# Patient Record
Sex: Male | Born: 1955
Health system: Southern US, Community
[De-identification: ages and names within clinical notes are randomized; demographics above are authoritative.]

## PROBLEM LIST (undated history)

## (undated) DIAGNOSIS — I10 Essential (primary) hypertension: Secondary | ICD-10-CM

## (undated) DIAGNOSIS — E785 Hyperlipidemia, unspecified: Secondary | ICD-10-CM

## (undated) DIAGNOSIS — B009 Herpesviral infection, unspecified: Secondary | ICD-10-CM

## (undated) DIAGNOSIS — K635 Polyp of colon: Secondary | ICD-10-CM

## (undated) DIAGNOSIS — K641 Second degree hemorrhoids: Principal | ICD-10-CM

## (undated) DIAGNOSIS — N3010 Interstitial cystitis (chronic) without hematuria: Secondary | ICD-10-CM

## (undated) DIAGNOSIS — T7840XA Allergy, unspecified, initial encounter: Secondary | ICD-10-CM

## (undated) DIAGNOSIS — G4730 Sleep apnea, unspecified: Secondary | ICD-10-CM

## (undated) DIAGNOSIS — G4700 Insomnia, unspecified: Secondary | ICD-10-CM

## (undated) DIAGNOSIS — N419 Inflammatory disease of prostate, unspecified: Secondary | ICD-10-CM

## (undated) HISTORY — DX: Hyperlipidemia, unspecified: E78.5

## (undated) HISTORY — DX: Insomnia, unspecified: G47.00

## (undated) HISTORY — DX: Sleep apnea, unspecified: G47.30

## (undated) HISTORY — PX: HEMORRHOID BANDING: SHX5850

## (undated) HISTORY — PX: CALCANEAL OSTEOTOMY W/ INTERNAL FIXATION: SHX1282

## (undated) HISTORY — DX: Allergy, unspecified, initial encounter: T78.40XA

## (undated) HISTORY — DX: Second degree hemorrhoids: K64.1

## (undated) HISTORY — DX: Interstitial cystitis (chronic) without hematuria: N30.10

## (undated) HISTORY — PX: FRACTURE SURGERY: SHX138

## (undated) HISTORY — DX: Essential (primary) hypertension: I10

## (undated) HISTORY — DX: Polyp of colon: K63.5

## (undated) HISTORY — DX: Herpesviral infection, unspecified: B00.9

---

## 2001-06-28 ENCOUNTER — Ambulatory Visit (HOSPITAL_BASED_OUTPATIENT_CLINIC_OR_DEPARTMENT_OTHER): Admission: RE | Admit: 2001-06-28 | Discharge: 2001-06-28 | Payer: Self-pay | Admitting: *Deleted

## 2001-08-03 ENCOUNTER — Ambulatory Visit (HOSPITAL_BASED_OUTPATIENT_CLINIC_OR_DEPARTMENT_OTHER): Admission: RE | Admit: 2001-08-03 | Discharge: 2001-08-03 | Payer: Self-pay | Admitting: *Deleted

## 2001-08-03 ENCOUNTER — Encounter (INDEPENDENT_AMBULATORY_CARE_PROVIDER_SITE_OTHER): Payer: Self-pay | Admitting: Specialist

## 2002-04-28 DIAGNOSIS — K635 Polyp of colon: Secondary | ICD-10-CM

## 2002-04-28 HISTORY — DX: Polyp of colon: K63.5

## 2006-04-11 ENCOUNTER — Ambulatory Visit (HOSPITAL_COMMUNITY): Admission: RE | Admit: 2006-04-11 | Discharge: 2006-04-11 | Payer: Self-pay | Admitting: Internal Medicine

## 2007-05-01 ENCOUNTER — Ambulatory Visit: Payer: Self-pay | Admitting: Gastroenterology

## 2007-05-13 ENCOUNTER — Ambulatory Visit: Payer: Self-pay | Admitting: Gastroenterology

## 2008-02-12 ENCOUNTER — Inpatient Hospital Stay (HOSPITAL_COMMUNITY): Admission: EM | Admit: 2008-02-12 | Discharge: 2008-02-14 | Payer: Self-pay | Admitting: Emergency Medicine

## 2008-02-23 ENCOUNTER — Ambulatory Visit (HOSPITAL_BASED_OUTPATIENT_CLINIC_OR_DEPARTMENT_OTHER): Admission: RE | Admit: 2008-02-23 | Discharge: 2008-02-24 | Payer: Self-pay | Admitting: Orthopedic Surgery

## 2011-02-12 NOTE — Consult Note (Signed)
Jeremy Fowler, Jeremy Fowler                 ACCOUNT NO.:  [id number]   MEDICAL RECORD NO.:  [id number]           PATIENT TYPE:   LOCATION:                                 FACILITY:   PHYSICIAN:  Madlyn Frankel. Charlann Boxer, M.D.  DATE OF BIRTH:  October 03, 1955   DATE OF CONSULTATION:  02/13/2008  DATE OF DISCHARGE:                                 CONSULTATION   CHIEF COMPLAINT:  Severe trauma, right foot pain/calcaneal fracture.   HISTORY:  Jeremy Fowler is a 55 year old otherwise healthy male who was  brought to the emergency room after he fell from unknown height with  loss of consciousness and amnesia to the event.  In the emergency room,  he was awake, alert and reported significant right foot pain.  Radiographs revealed a right calcaneus fracture.  A CT scan was  subsequently ordered.  At the time of evaluation, he was in the  intensive care unit and had no other complaints of upper or lower  extremity injuries.   PAST MEDICAL HISTORY:  Consistent only for some benign prostatic  hypertrophy.   SURGICAL HISTORY:  Includes herniorrhaphy as a child and a UPPP.   SOCIAL HISTORY:  He denies any illicit drugs.  Does smoke cigars  occasionally, has alcohol.  He works as a Occupational hygienist.  He is married, lives  in [place].   DRUG ALLERGIES:  SULFA.   MEDICATIONS:  Avodart and doxycycline.   REVIEW OF SYSTEMS:  Otherwise, negative at the time of presentation.   PHYSICAL EXAMINATION:  At the time of admission include a temperature  97.3, pulse 67 and stable, blood pressure 122/81.  At the time of  evaluation, his vital signs were stable.  At the time of evaluation, his  general medical exam was deferred to the trauma service as orthopedic  exam consistent with secondary surveillance revealed no obvious bruising  or deformity in the upper or lower extremities.   The specific examination the right foot revealed brisk capillary refill,  he had already had a U-type splint in placed with some discomfort.  No  evidence of open lesions.   There is obvious swelling in the hindfoot already.  He does have  palpable pulses, pain with movement.   Labs remained stable.   RADIOGRAPHS:  Plain films indicated comminuted calcaneus fracture.  A CT  scan revealed and confirmed the same with intra-articular extension and  displaced fragments.   ASSESSMENT:  A closed left comminuted calcaneal fracture associated with  head injury.   PLAN:  Reviewed with Jeremy Fowler and his family his current situation.  He  will be nonweightbearing with elevation.  I will switch him out from a U  to an L splint to allow for swelling pain relief.  We will switch him  over to oxycodone for pain relief.  He is being followed by the  neurosurgical service currently for his head injury and was subsequently  transferred to floor once cleared.  He will be nonweightbearing using  crutches.  I will review this case with Dr. Leonides Grills for referral  post discharge for potential  surgical consultation.  We did provide him  with some printout pictures to evaluate his fracture for his curiosity  questions.  I have encouraged him to read regarding this plan.      Madlyn Frankel Charlann Boxer, M.D.  Electronically Signed     MDO/MEDQ  D:  02/13/2008  T:  02/13/2008  Job:  [id number]

## 2011-02-12 NOTE — Op Note (Signed)
NAMEGIANI, BETZOLD                 ACCOUNT NO.:  [id number]   MEDICAL RECORD NO.:  [id number]          PATIENT TYPE:  AMB   LOCATION:  DSC                          FACILITY:  [facility]   PHYSICIAN:  Leonides Grills, M.D.     DATE OF BIRTH:  [birth year]   DATE OF PROCEDURE:  02/23/2008  DATE OF DISCHARGE:                               OPERATIVE REPORT   PREOPERATIVE DIAGNOSIS:  Jeremy Fowler 2-part right intra-articular comminuted  calcaneus fracture.   POSTOPERATIVE DIAGNOSIS:  Jeremy Fowler 2-part right intra-articular  comminuted calcaneus fracture.   OPERATION:  1. Open reduction and internal fixation, right calcaneus fracture.  2. Right sural nerve neurolysis.  3. Stress x-rays, right foot.   ANESTHESIA:  General.   SURGEON:  Leonides Grills, MD   ASSISTANT:  Richardean Canal, PA-C   ESTIMATED BLOOD LOSS:  Minimal.   TOURNIQUET TIME:  2 hours.   COMPLICATIONS:  None.   DISPOSITION:  Stable to PR.   INDICATIONS:  This is a 55 year old male who fell approximately 10-feet  off a ladder and sustained the above injury.  He was consented with the  above procedure.  All risks of infection, vessel injury, nonunion,  malunion, hardware reduction or failure, persisting or worsening pain,  prolonged recovery, stiffness, arthritis, and possibility of fusion in  the future were all explained.  Questions were encouraged and answered.   PROCEDURE IN DETAIL:  The patient was brought to the operating room and  was placed in the supine position initially, after adequate general  anesthesia was administered as well as Ancef 1 gram IV piggyback.  The  patient was then placed in full lateral decubitus position with the  operative side up on a beanbag.  All bony prominence well padded.  Axillary roll was placed to the right lower extremity and was prepped  and draped in sterile manner over a proximally thigh tourniquet.  The  limb was then gravity exsanguinated and the tourniquet was elevated to  290 mmHg.  A  lateral extensile approach to the calcaneus was then  performed as described by [facility].  We then identified  the sural nerve and a formal sural nerve neurolysis was then performed.  A flap was then retracted out of the harms way and once the flap was  elevated, a no-touch technique was then performed using 0.025 K-wires  into the edge of the talus.  Once the K-wires were bent and no-touch  technique was established, we then removed the lateral wall of calcaneus  as well as the lateral portion of the posterior facet of the calcaneus.  We then removed any hematoma or fracture fragments that were loose and  placed this on the back table.  We then anatomically reduced the medial  wall using a blunt-tip wooden handle elevator and then we placed 2.0-mm  K-wires axially, and then obtained x-rays of axial plane to show that  the medial wall was anatomically reduced.  We then reduced the neck and  anterior process of CC joint.  Once this was anatomically reduced, 1.25  K-wires were placed to provisionally fix  this as well.  We then reduced  the posterior facet and once this was anatomically reduced as well as  the calcaneal tuber posteriorly, again we provisionally fixed this with  1.25 K-wires.  We then chose a Synthes right-sided small locking plate.  We prebent this and cut this to fit his calcaneus as well.  Prior to  applying the plate, we placed a 5 mL of allograft cancellus chips into  the calcaneal defect that was left prior to applying the lateral wall.  Once the plate was applied, we then sequentially placed first a 3.5-mm  fully-threaded cortical lag screw using a 3.5 and 2.5 drill hole  respectively.  This actually reduced the lateral wall compressing it,  using the plate as a washer.  We then sequentially placed 2 independent  lag screws in the thalamic portion of the posterior facet using a 3.5  and 2.5 mm drill hole respectively.  These were 3.5-mm fully-threaded   cortical lag screws.  This had outstanding purchasing impression across  the fracture site.  This was visually seen as well as palpated with a  Glorious Peach as well as seen with x-ray guidance in the mortise view and Broden  view.  We removed those provisional K-wires from this area.  We then  placed one more independent screw in the calcaneal tuber, again this was  a 3.5-mm fully-threaded cortical lag screw using 3.5 and 2.5 mm drill  hole respectively.  This had an excellent purchasing pressure across  this area and again the provisional K-wire, that was holding this area  was removed as well.  We then placed independent locking screws through  the plate using the locking sleeve and 2.8-mm drill holes respectively.  Once this was sequentially placed, provisional K-wire fixation was  removed and stress x-rays were obtained.  AP, lateral, axial views, and  mortise views showed no gross motion, fixation, or proposition, and  external alignment is well.  Range of motion of the ankle was excellent  clinically.  The hindfoot was in neutral alignment to slight valgus.  Once again, the area was copiously irrigated with normal saline.  Tourniquet deflated.  Hemostasis was obtained.  There was no pulsatile  bleeding.  Subcu was closed with multiple sequential 2-0 Vicryl stitch  followed by Allgower-Donati stitches to close the skin.  Sterile  dressing was applied.  Modified Jones dressing was applied with ankle  and knee in dorsiflexion.  The patient was stable to the PR.      Leonides Grills, M.D.  Electronically Signed     PB/MEDQ  D:  02/23/2008  T:  02/24/2008  Job:  [id number]

## 2011-02-12 NOTE — Discharge Summary (Signed)
NAMEEVERTON, BERTHA                 ACCOUNT NO.:  [id number]   MEDICAL RECORD NO.:  [id number]          PATIENT TYPE:  INP   LOCATION:  5005                         FACILITY:  [facility]   PHYSICIAN:  Cherylynn Ridges, M.D.    DATE OF BIRTH:  [birth year]   DATE OF ADMISSION:  02/12/2008  DATE OF DISCHARGE:  02/14/2008                               DISCHARGE SUMMARY   DISCHARGE DIAGNOSES:  1. Fall.  2. Traumatic brain injury with intracerebral contusion.  3. Right calcaneus fracture.  4. Benign prostatic hypertrophy/prostatitis.  5. Acute blood loss anemia.   CONSULTANTS:  Dr. Charlann Boxer and Dr. Lestine Box, orthopedic surgery.   PROCEDURES:  None.   HISTORY OF PRESENT ILLNESS:  This is a 55 year old white male who fell  somewhere between 6 and 10 feet off a ladder.  His wife heard the  commotion and found him down and unconscious.  He was amnestic to the  event and confused at the scene.  He comes in as a silver trauma alert  complaining mainly of right foot pain.  Workup demonstrated a left deep  parietal intracerebral contusion near the thalamus and a severely  comminuted right calcaneus fracture.  He was admitted for consultation  and observation to the intensive care unit.   HOSPITAL COURSE:  The patient's hospital course was uneventful.  His  followup head CT the following morning was slightly improved, and he had  cleared mentally to a great extent.  His right calcaneus fracture was  splinted and the plan from orthopedic surgery was to wait for decrease  in swelling and to fix the fracture as an outpatient.  He was cleared by  neurosurgery for discharge home, and he was sent there in good condition  and the care of his wife.   DISCHARGE MEDICATIONS:  1. Robaxin 500 mg p.o. q.6 h. p.r.n.  2. Oxycodone p.o. q.4 h. P.r.n.  3. In addition, he is to resume his home medications which include      doxycycline and Avodart.   FOLLOWUP:  The patient will follow up with Dr. Lestine Box in 1-2 weeks  and  will call his office for an appointment.  He will follow up with the  trauma service on an as needed basis, and may call with questions or  concerns.      Earney Hamburg, P.A.      Cherylynn Ridges, M.D.  Electronically Signed    MJ/MEDQ  D:  02/14/2008  T:  02/14/2008  Job:  [id number]   cc:   Leonides Grills, M.D.

## 2011-02-12 NOTE — Consult Note (Signed)
NAMEARVIN, Jeremy NO.:  [id number]   MEDICAL RECORD NO.:  [id number]          PATIENT TYPE:  EMS   LOCATION:  MAJO                         FACILITY:  [facility]   PHYSICIAN:  Tia Alert, MD     DATE OF BIRTH:  [birth year]   DATE OF CONSULTATION:  DATE OF DISCHARGE:                                 CONSULTATION   CHIEF COMPLAINT:  Closed head injury.   HISTORY OF PRESENT ILLNESS:  Jeremy Fowler is a very pleasant 55 year old  gentleman who is brought to emergency department by EMS after a fall.  He was trying to clean his gutters at home.  He does not have  recollection of the event.  He has amnesia for it.  First memory has is  in the ambulance on the way when they asked how he is doing, there was  questionable loss of consciousness.  He was confused at the scene and  complained of right foot pain.  He has been found to have a right  calcaneal fracture.  The fall is apparently 6-10 feet off of a ladder.  He denies any headache or any numbness, tingling or weakness, double  vision or visual changes.   PAST MEDICAL HISTORY:  Includes BPH.   MEDICATIONS:  Doxycycline and Avodart.   ALLERGIES:  SULFA.   SOCIAL HISTORY:  Use of occasional social alcohol and cigars.   PHYSICAL EXAMINATION:  VITAL SIGNS:  Temperature 97.3, pulse 67,  respirations 18, blood pressure 122/81,  oxygen saturations 100%.  GENERAL:  Pleasant, cooperative white male in no acute distress.  HEENT:  Normocephalic, atraumatic.  Extraocular movements were intact.  Pupils equal and reactive.  Oropharynx benign.  NECK:  Supple, nontender.  HEART:  Regular rhythm.  EXTREMITIES:  No clubbing, cyanosis, or edema, but his right foot is in  a cast and is elevated.  NEUROLOGIC:  He is awake and alert.  He is pleasant, interactive.  No  aphasia.  He has good attention span.  His fund of knowledge and memory  appear to be appropriate except for amnesia for the event.  He has no  facial asymmetries.   Tongue protrudes in midline.  He has good shoulder  shrug.  His strength appears to be good.  Nuchal muscle tone.  Nuchal  muscle bulk.  His sensation is grossly intact.  He has no pronator  drift.  Gait is not tested.  Coordination and balance were not tested.   IMAGING STUDY:  CT scan, I have reviewed as well as report.  It does  show a small hyperdensity in the left thalamus consistent with a shear  hemorrhage.  There remainder of the exam was normal.  The basal cisterns  are open.  There is no shift or mass effect or hydrocephalus.   ASSESSMENT/PLAN:  This is a 55 year old gentleman with a closed head  injury from a fall.  I would repeat his head CT in 24 hours.  He can be  observed on the regular floor, in my opinion, with neurologic checks  every 4 hours.  Post concussion symptoms  were explained to the patient  in detail.  I will follow him while he is in the hospital.  Call for any  change in mental status.      Tia Alert, MD  Electronically Signed     DSJ/MEDQ  D:  02/12/2008  T:  02/13/2008  Job:  [id number]

## 2011-02-15 NOTE — Op Note (Signed)
[place]. [facility]  Patient:    Jeremy Fowler, Jeremy Fowler Visit Number: [id number] MRN: [id number]          Service Type: DSU Location: [facility] Attending Physician:  Claudina Lick Dictated by:   Doris Cheadle. Lyman Bishop, M.D. Proc. Date: 08/03/01 Admit Date:  08/03/2001                             Operative Report  PREOPERATIVE DIAGNOSES: 1. Deviated nasal septum. 2. Nasal turbinate hypertrophy. 3. Obstructive sleep apnea syndrome with snoring.  POSTOPERATIVE DIAGNOSES: 1. Deviated nasal septum. 2. Nasal turbinate hypertrophy. 3. Obstructive sleep apnea syndrome with snoring.  PROCEDURES: 1. Nasal septoplasty. 2. Submucous resection, left inferior nasal turbinate. 3. Uvulopalatopharyngoplasty.  SURGEON:  Robert L. Lyman Bishop, M.D.  ANESTHESIA:  General.  INDICATION FOR PROCEDURE:  This patient has had a long history of difficulty breathing through his nose, also severe snoring at night.  Examination in the office showed severely twisted nasal septum with enlarged left middle and inferior turbinates and a thickened, elongated uvula.  CT scan of the sinuses within normal limits.  This patients sleep study showed mild apnea and moderate snoring.  The patient admitted for surgery.  DESCRIPTION OF PROCEDURE:  After satisfactory general endotracheal anesthesia had been induced, topical epinephrine packs were placed intranasally, after which the lower edge of the palate, the nasal septum, and the left inferior nasal turbinate were infiltrated with 1% Xylocaine containing 1:100,000 epinephrine for hemostasis, using a total of 12 cc.  The nose and face were then prepped with Betadine and sterile drapes were applied.  Examination showed a caudal deflection of the nasal septum into the left naris, the septum then curving back to the right, displaced off the maxillary crest, then curving back to the left more posteriorly but with again a vomerine spur to the  right posteriorly.  There was compensatory enlargement of the left middle and inferior turbinates.  An incision was made along the caudal margin of the septum, the mucoperichondrium and periosteum elevated on both sides of the cartilaginous septum.  The cartilage was then dissected up from the inferior nasal spine and maxillary crest and then partially separated from the bony septum, after which the mucoperiosteal flaps posteriorly were then elevated. A spur was removed from the right side of the maxillary crest with a chisel, and a thickened, deviated portion of the bony perpendicular plate was removed with the Jansen-Middleton bone-biting forceps, and also the vomerine spur to the right posteriorly was removed.  The caudal 1 cm of cartilage curved sharply to the left.  This was corrected by making multiple cross-hatches in the convexity of the curvature, after which the caudal 1 cm of the septum could be swung to the midline.  Then a pocket was made in the membranous columella.  An incision was made in the right posterior septal flap for drainage purposes.  The posterior septal flaps were reapproximated with a mattress suture of 4-0 plain gut.  The cartilaginous septum was repositioned and stabilized over the midline with three mattress sutures of 4-0 Vicryl placed in the [facility] technique.  Caudal margin of the septum was then placed in the membranous columella pocket and the septal incision closed with running 5-0 chromic gut, and the caudal margin of the septum was maintained and positioned in the midline with a vertical mattress suture of 5-0 Vicryl placed in the [facility] technique.  A small relaxing incision was made  in the left inferior septal flap.  A small incision was then made over the anterior end of the left inferior nasal turbinate, mucoperiosteum elevated, excess turbinate bone removed, and the incision closed with interrupted 5-0 chromic gut.  The left middle turbinate was  crushed and lateralized.  A small pledget of Surgicel was placed over the relaxing incision of the left inferior anterior septum, and a Doyle splint was then placed in each side of the nasal cavity and secured anteriorly with a suture of 5-0 Vicryl tied down over a bolster of Telfa.  A Crowe-Davis mouth gag was then inserted and suspended from the Mayo stand, and the lower 2-3 mm of soft palate and the uvula was then excised over to the apex of the tonsillar fossa on each side and the anterior and posterior mucosal margins were then closed with a running 4-0 chromic gut.  Depo-Medrol 20 mg was infiltrated into the left inferior and right inferior nasal turbinates and an additional 20 mg was infiltrated into the soft palate. Estimated blood loss 15-20 cc.  The patient was given 1 g of Ancef IV intraoperatively and 12 mg of Decadron IV intraoperatively.  The patient tolerated the procedure well, was awakened from anesthesia and taken to the recovery room in satisfactory condition. Dictated by:   Doris Cheadle. Lyman Bishop, M.D. Attending Physician:  Claudina Lick DD:  08/03/01 TD:  08/04/01 Job: [id number]

## 2011-06-26 LAB — BASIC METABOLIC PANEL
CO2: 22
CO2: 27
Chloride: 102
Chloride: 105
Creatinine, Ser: 0.8
Creatinine, Ser: 0.96
Glucose, Bld: 103 — ABNORMAL HIGH
Glucose, Bld: 116 — ABNORMAL HIGH
Potassium: 4.5
Sodium: 138

## 2011-06-26 LAB — DIFFERENTIAL
Basophils Relative: 0
Eosinophils Absolute: 0
Lymphocytes Relative: 5 — ABNORMAL LOW
Monocytes Absolute: 0.5
Monocytes Relative: 3

## 2011-06-26 LAB — CBC
HCT: 41.5
Hemoglobin: 13.8
MCHC: 35.8
MCV: 95.7
Platelets: 171
RBC: 4.34
RDW: 12.2

## 2011-06-26 LAB — PROTIME-INR: INR: 1

## 2011-06-26 LAB — SAMPLE TO BLOOD BANK

## 2011-12-02 ENCOUNTER — Ambulatory Visit (HOSPITAL_COMMUNITY)
Admission: RE | Admit: 2011-12-02 | Discharge: 2011-12-02 | Disposition: A | Discharge status: Home / Self Care | Payer: BC Managed Care – PPO | Source: Ambulatory Visit | Attending: Internal Medicine | Admitting: Internal Medicine

## 2011-12-02 ENCOUNTER — Other Ambulatory Visit (HOSPITAL_COMMUNITY): Payer: Self-pay | Admitting: Internal Medicine

## 2011-12-02 DIAGNOSIS — Z0000 Encounter for general adult medical examination without abnormal findings: Secondary | ICD-10-CM | POA: Insufficient documentation

## 2011-12-02 DIAGNOSIS — R209 Unspecified disturbances of skin sensation: Secondary | ICD-10-CM | POA: Insufficient documentation

## 2011-12-02 DIAGNOSIS — M51379 Other intervertebral disc degeneration, lumbosacral region without mention of lumbar back pain or lower extremity pain: Secondary | ICD-10-CM | POA: Insufficient documentation

## 2011-12-02 DIAGNOSIS — M545 Low back pain, unspecified: Secondary | ICD-10-CM | POA: Insufficient documentation

## 2011-12-02 DIAGNOSIS — M5137 Other intervertebral disc degeneration, lumbosacral region: Secondary | ICD-10-CM | POA: Insufficient documentation

## 2012-07-05 IMAGING — CR DG LUMBAR SPINE COMPLETE 4+V
5 series · 5 of 5 positions shown · non-contrast
Comparison: 02/12/2008.

CLINICAL DATA: 55-year-old male with low back pain, lower extremity
tingling.

LUMBAR SPINE - COMPLETE 4+ VIEW

[view not recorded (1 of 5)]
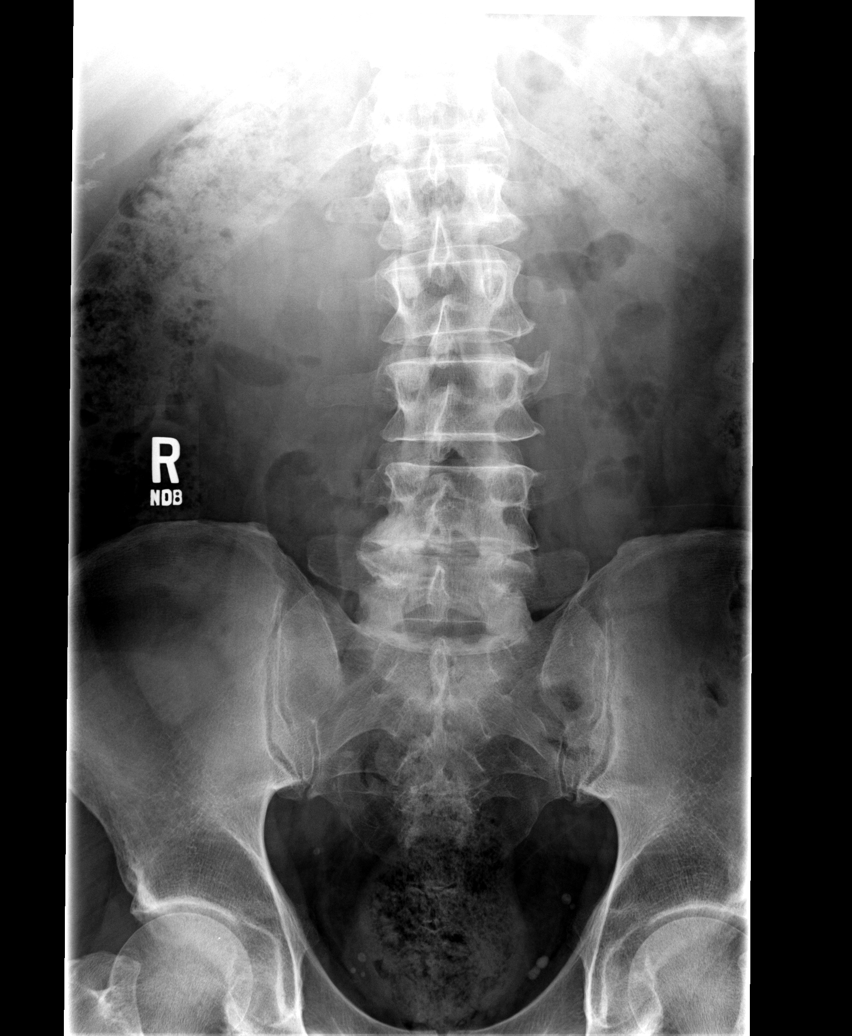

[view not recorded (2 of 5)]
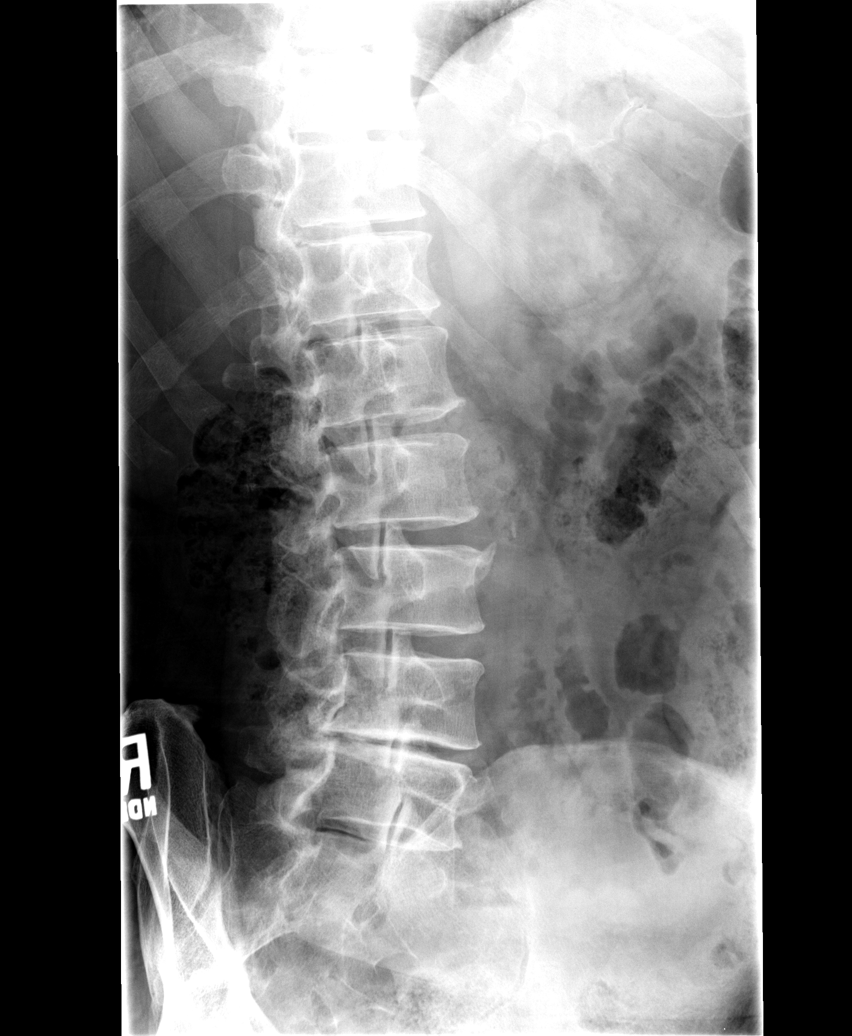

[view not recorded (3 of 5)]
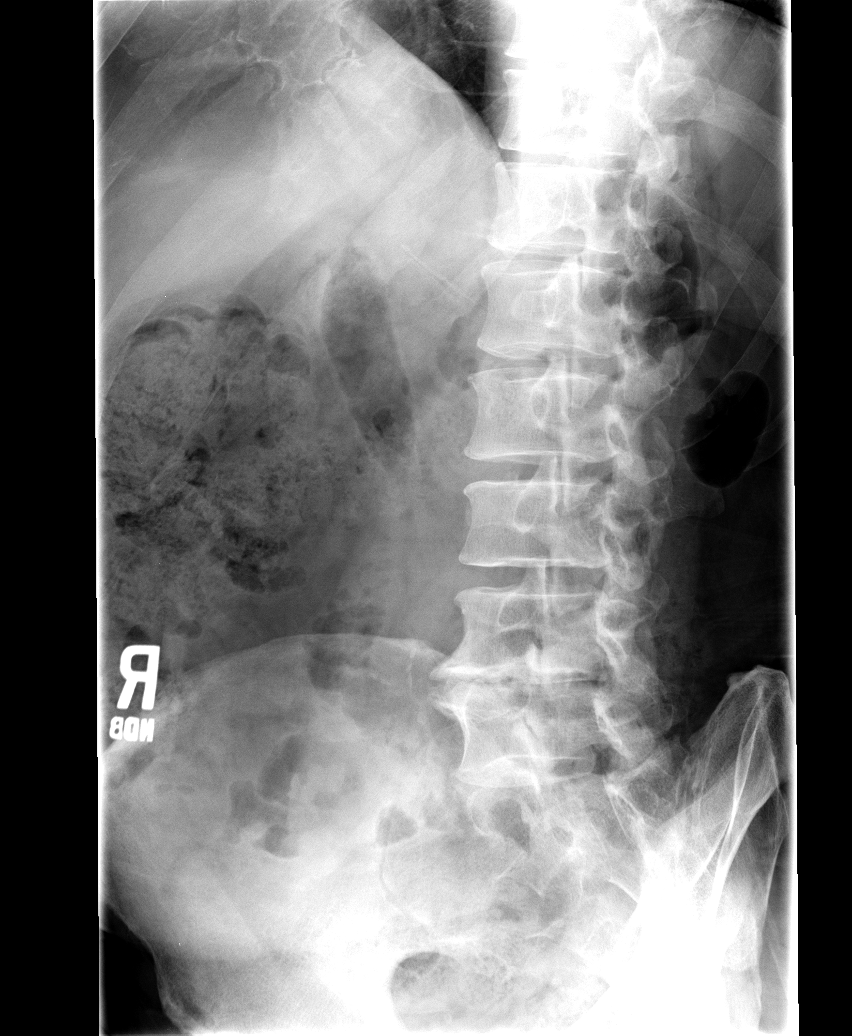

[view not recorded (4 of 5)]
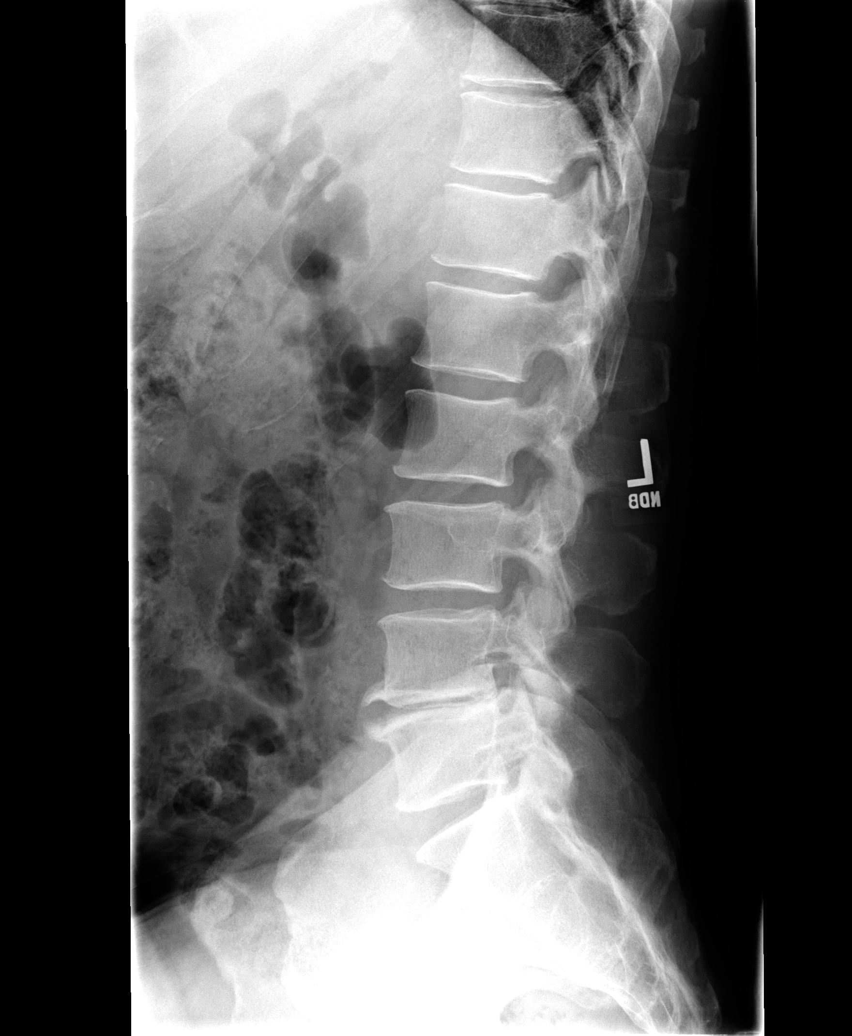

[view not recorded (5 of 5)]
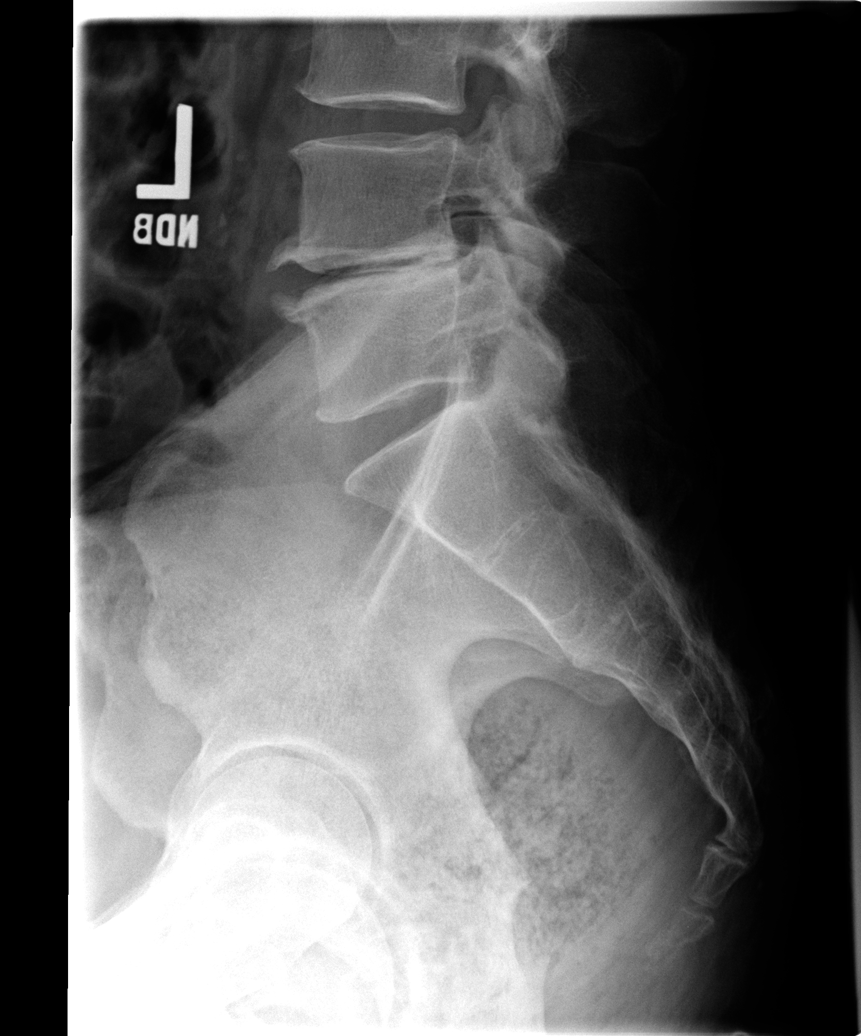

[5 of 5 positions shown; findings below may reference images not displayed]

FINDINGS: Normal lumbar segmentation.  Stable vertebral height and
alignment.  Chronic disc space loss at L4-L5 is severe.
Degenerative endplate changes have also mildly progressed.
Elsewhere disc height is relatively preserved.  Bone mineralization
is within normal limits.  No pars fracture.  There is moderate to
severe L4-L5 and L5-S1 facet hypertrophy, worse on the left.
Negative sacrum and SI joints.
IMPRESSION: 1.  Chronic severe L4-L5 disc degeneration with some progression
since [DATE].  Left greater than right L4-L5 and L5-L1 facet degeneration.

## 2012-07-28 ENCOUNTER — Telehealth: Payer: Self-pay | Admitting: Gastroenterology

## 2012-07-28 NOTE — Telephone Encounter (Signed)
Pt scheduled to see Mike Gip PA tomorrow at 11am. Pt has been having blood in his stool and has a strong family history of colon cancer. Requesting pt be seen. Lurena Joiner to notify pt of appt date and time and fax records on pt.

## 2012-07-29 ENCOUNTER — Ambulatory Visit (INDEPENDENT_AMBULATORY_CARE_PROVIDER_SITE_OTHER): Payer: BC Managed Care – PPO | Admitting: Physician Assistant

## 2012-07-29 ENCOUNTER — Encounter: Payer: Self-pay | Admitting: *Deleted

## 2012-07-29 ENCOUNTER — Encounter: Payer: Self-pay | Admitting: Gastroenterology

## 2012-07-29 VITALS — BP 120/68 | HR 60 | Ht 72.0 in | Wt 213.6 lb

## 2012-07-29 DIAGNOSIS — K625 Hemorrhage of anus and rectum: Secondary | ICD-10-CM

## 2012-07-29 DIAGNOSIS — Z860100 Personal history of colon polyps, unspecified: Secondary | ICD-10-CM | POA: Insufficient documentation

## 2012-07-29 DIAGNOSIS — N400 Enlarged prostate without lower urinary tract symptoms: Secondary | ICD-10-CM

## 2012-07-29 DIAGNOSIS — Z800 Family history of malignant neoplasm of digestive organs: Secondary | ICD-10-CM

## 2012-07-29 MED ORDER — HYDROCORTISONE ACETATE 25 MG RE SUPP: 25.0000 mg | Freq: Every day | RECTAL | Status: DC

## 2012-07-29 MED ORDER — MOVIPREP 100 G PO SOLR: 1.0000 | Freq: Once | ORAL | Status: DC

## 2012-07-29 NOTE — Progress Notes (Signed)
Subjective:    Patient ID: Jeremy Fowler, male    DOB: 11/11/1955, 56 y.o.   MRN: [id number]  HPI Gerrett is a 56-year-old white male, primary patient of Dr. McKeown, and known in the past to Dr. Kaplan from prior colonoscopies. Patient had colonoscopy in 2003 for surveillance ,and was found to have one 3 mm polyp in the rectum which was hot biopsied . I do not have path from that procedure. He had followup colonoscopy in August of 2008 because of family history of colon cancer in both of his grandmothers. This was a normal exam. He is referred today, with complaints of intermittent small amounts of bright red blood per rectum noted over the past 4-6 months. He says he would see very small amounts of blood for a couple of days which would then resolve and eventually recur. Since last week he has been seeing   bright red blood with each bowel movement. He reports  Normal appearing stool with bright red blood,he thinks mixed with the stool and in the commode. He has no complaints of abdominal pain or cramping. No changes in his bowel habits or problems with constipation or diarrhea. He says that he has had hemorrhoidal symptoms in the past but has not had any  rectal discomfort, pressure etc. over the past several months . He was seen Dr. McKeown's  office on October 28 and had Hemoccult-positive stool. He states that he had a rectal exam and was not noted to have any hemorrhoids. We have copies of labs from that visit. WBC 6.3 hemoglobin 15.6 hematocrit of 44.3, MCV of 94 ,serum iron of 69 iron sat of 21 lytes, within normal limits, cholesterol was 263 and tried triglycerides of 493.    Review of Systems  Constitutional: Negative.   HENT: Negative.   Eyes: Negative.   Respiratory: Negative.   Cardiovascular: Negative.   Gastrointestinal: Positive for blood in stool.  Skin: Negative.   Neurological: Negative.   Psychiatric/Behavioral: Negative.    Outpatient Encounter Prescriptions as of 07/29/2012    Medication Sig Dispense Refill  . FINASTERIDE PO Take by mouth.      . hydrocortisone (ANUSOL-HC) 25 MG suppository Place 1 suppository (25 mg total) rectally at bedtime.  12 suppository  0  . MOVIPREP 100 G SOLR Take 1 kit (100 g total) by mouth once.  1 kit  0       No Known Allergies Patient Active Problem List  Diagnosis  . BPH (benign prostatic hyperplasia)  . Hx of colonic polyps  . Family hx of colon cancer   History  Substance Use Topics  . Smoking status: Never Smoker   . Smokeless tobacco: Never Used  . Alcohol Use: Yes    Objective:   Physical Exam well-developed white male in no acute distress, blood pressure 120/68 pulse 60 height 6 foot weight 213. HEENT; nontraumatic normocephalic EOMI PERRLA sclera anicteric Neck; Supple no JVD, Cardiovascular; regular rate and rhythm with S1-S2 no murmur or gallop, Pulmonary; clear bilaterally, Abdomen; soft nontender nondistended, bowel sounds are present there is no palpable mass or hepatosplenomegaly no guarding or rebound, Rectal; exam not done, Extremities; no clubbing cyanosis or edema skin warm and dry, Psych; mood and affect normal and appropriate.        Assessment & Plan:  #1 56-year-old white male with intermittent hematochezia x6 months, now presenting with one-week history of daily small-volume hematochezia. Patient has history of colon polyps and positive family history of colon cancer   with last surveillance colonoscopy done 5 years ago. Will need to rule out occult colon lesion. Versus possible internal hemorrhoidal bleeding.  Plan; will schedule for colonoscopy with Dr. Kaplan; procedure discussed in detail with the patient and he is agreeable to proceed. Patient specifically asks about treatment for internal hemorrhoids if present, and whether that could be done at the time of his colonoscopy. We discussed banding of internal hemorrhoids as an option and he would like this done if indicated.  Trial of Anusol-HC  suppositories each bedtime in the interim until colonoscopy is completed. 

## 2012-07-29 NOTE — Progress Notes (Signed)
Reviewed and agree with management. Robert D. Kaplan, M.D., FACG  

## 2012-07-29 NOTE — Patient Instructions (Addendum)
You have been scheduled for a colonoscopy.Please follow written instructions given to you at your visit today.  Please pick up your prep kit at the pharmacy within the next 1-3 days. If you use inhalers (even only as needed) or a CPAP machine, please bring them with you on the day of your procedure.  We have sent the following medication to your pharmacy for you to pick up at your convenience:Moviprep and Anusol Suppositories

## 2012-08-04 ENCOUNTER — Encounter (HOSPITAL_COMMUNITY): Payer: Self-pay

## 2012-08-04 ENCOUNTER — Ambulatory Visit (HOSPITAL_COMMUNITY)
Admission: RE | Admit: 2012-08-04 | Discharge: 2012-08-04 | Disposition: A | Discharge status: Home / Self Care | Payer: BC Managed Care – PPO | Source: Ambulatory Visit | Attending: Gastroenterology | Admitting: Gastroenterology

## 2012-08-04 ENCOUNTER — Encounter (HOSPITAL_COMMUNITY)
Admission: RE | Disposition: A | Discharge status: Home / Self Care | Payer: Self-pay | Source: Ambulatory Visit | Attending: Gastroenterology

## 2012-08-04 DIAGNOSIS — K648 Other hemorrhoids: Secondary | ICD-10-CM | POA: Insufficient documentation

## 2012-08-04 DIAGNOSIS — K620 Anal polyp: Secondary | ICD-10-CM | POA: Insufficient documentation

## 2012-08-04 DIAGNOSIS — K625 Hemorrhage of anus and rectum: Secondary | ICD-10-CM | POA: Insufficient documentation

## 2012-08-04 DIAGNOSIS — K621 Rectal polyp: Secondary | ICD-10-CM | POA: Insufficient documentation

## 2012-08-04 DIAGNOSIS — K641 Second degree hemorrhoids: Secondary | ICD-10-CM

## 2012-08-04 DIAGNOSIS — D126 Benign neoplasm of colon, unspecified: Secondary | ICD-10-CM

## 2012-08-04 DIAGNOSIS — Z800 Family history of malignant neoplasm of digestive organs: Secondary | ICD-10-CM | POA: Insufficient documentation

## 2012-08-04 HISTORY — DX: Inflammatory disease of prostate, unspecified: N41.9

## 2012-08-04 HISTORY — PX: COLONOSCOPY: SHX5424

## 2012-08-04 HISTORY — DX: Second degree hemorrhoids: K64.1

## 2012-08-04 SURGERY — COLONOSCOPY
Anesthesia: Moderate Sedation

## 2012-08-04 MED ORDER — MIDAZOLAM HCL 10 MG/2ML IJ SOLN
INTRAMUSCULAR | Status: AC
  Filled 2012-08-04: qty 2

## 2012-08-04 MED ORDER — MIDAZOLAM HCL 5 MG/5ML IJ SOLN
INTRAMUSCULAR | Status: DC | PRN
  Administered 2012-08-04 (×3): 2 mg via INTRAVENOUS

## 2012-08-04 MED ORDER — DIPHENHYDRAMINE HCL 50 MG/ML IJ SOLN
INTRAMUSCULAR | Status: DC | PRN
  Administered 2012-08-04: 25 mg via INTRAVENOUS

## 2012-08-04 MED ORDER — FENTANYL CITRATE 0.05 MG/ML IJ SOLN
INTRAMUSCULAR | Status: AC
  Filled 2012-08-04: qty 2

## 2012-08-04 MED ORDER — FENTANYL CITRATE 0.05 MG/ML IJ SOLN
INTRAMUSCULAR | Status: DC | PRN
  Administered 2012-08-04 (×3): 25 ug via INTRAVENOUS

## 2012-08-04 MED ORDER — DIPHENHYDRAMINE HCL 50 MG/ML IJ SOLN
INTRAMUSCULAR | Status: AC
  Filled 2012-08-04: qty 1

## 2012-08-04 NOTE — H&P (View-Only) (Signed)
Subjective:    Patient ID: Jeremy Fowler, male    DOB: 02-01-1956, 56 y.o.   MRN: [id number]  HPI [name] is a 56 year old white male, primary patient of Dr. Oneta Rack, and known in the past to Dr. Arlyce Dice from prior colonoscopies. Patient had colonoscopy in 2003 for surveillance ,and was found to have one 3 mm polyp in the rectum which was hot biopsied . I do not have path from that procedure. He had followup colonoscopy in August of 2008 because of family history of colon cancer in both of his grandmothers. This was a normal exam. He is referred today, with complaints of intermittent small amounts of bright red blood per rectum noted over the past 4-6 months. He says he would see very small amounts of blood for a couple of days which would then resolve and eventually recur. Since last week he has been seeing   bright red blood with each bowel movement. He reports  Normal appearing stool with bright red blood,he thinks mixed with the stool and in the commode. He has no complaints of abdominal pain or cramping. No changes in his bowel habits or problems with constipation or diarrhea. He says that he has had hemorrhoidal symptoms in the past but has not had any  rectal discomfort, pressure etc. over the past several months . He was seen Dr. Kathryne Sharper  office on October 28 and had Hemoccult-positive stool. He states that he had a rectal exam and was not noted to have any hemorrhoids. We have copies of labs from that visit. WBC 6.3 hemoglobin 15.6 hematocrit of 44.3, MCV of 94 ,serum iron of 69 iron sat of 21 lytes, within normal limits, cholesterol was 263 and tried triglycerides of 493.    Review of Systems  Constitutional: Negative.   HENT: Negative.   Eyes: Negative.   Respiratory: Negative.   Cardiovascular: Negative.   Gastrointestinal: Positive for blood in stool.  Skin: Negative.   Neurological: Negative.   Psychiatric/Behavioral: Negative.    Outpatient Encounter Prescriptions as of 07/29/2012    Medication Sig Dispense Refill  . FINASTERIDE PO Take by mouth.      . hydrocortisone (ANUSOL-HC) 25 MG suppository Place 1 suppository (25 mg total) rectally at bedtime.  12 suppository  0  . MOVIPREP 100 G SOLR Take 1 kit (100 g total) by mouth once.  1 kit  0       No Known Allergies Patient Active Problem List  Diagnosis  . BPH (benign prostatic hyperplasia)  . Hx of colonic polyps  . Family hx of colon cancer   History  Substance Use Topics  . Smoking status: Never Smoker   . Smokeless tobacco: Never Used  . Alcohol Use: Yes    Objective:   Physical Exam well-developed white male in no acute distress, blood pressure 120/68 pulse 60 height 6 foot weight 213. HEENT; nontraumatic normocephalic EOMI PERRLA sclera anicteric Neck; Supple no JVD, Cardiovascular; regular rate and rhythm with S1-S2 no murmur or gallop, Pulmonary; clear bilaterally, Abdomen; soft nontender nondistended, bowel sounds are present there is no palpable mass or hepatosplenomegaly no guarding or rebound, Rectal; exam not done, Extremities; no clubbing cyanosis or edema skin warm and dry, Psych; mood and affect normal and appropriate.        Assessment & Plan:  #6 56 year old white male with intermittent hematochezia x6 months, now presenting with one-week history of daily small-volume hematochezia. Patient has history of colon polyps and positive family history of colon cancer  with last surveillance colonoscopy done 5 years ago. Will need to rule out occult colon lesion. Versus possible internal hemorrhoidal bleeding.  Plan; will schedule for colonoscopy with Dr. Arlyce Dice; procedure discussed in detail with the patient and he is agreeable to proceed. Patient specifically asks about treatment for internal hemorrhoids if present, and whether that could be done at the time of his colonoscopy. We discussed banding of internal hemorrhoids as an option and he would like this done if indicated.  Trial of Anusol-HC  suppositories each bedtime in the interim until colonoscopy is completed.

## 2012-08-04 NOTE — Interval H&P Note (Signed)
History and Physical Interval Note:  08/04/2012 11:28 AM  Jeremy Fowler  has presented today for surgery, with the diagnosis of Rectal bleeding [569.3]  The various methods of treatment have been discussed with the patient and family. After consideration of risks, benefits and other options for treatment, the patient has consented to  Procedure(s) (LRB) with comments: COLONOSCOPY (N/A) - with possible banding as a surgical intervention .  The patient's history has been reviewed, patient examined, no change in status, stable for surgery.  I have reviewed the patient's chart and labs.  Questions were answered to the patient's satisfaction.    The recent H&P (dated *07/28/12**) was reviewed, the patient was examined and there is no change in the patients condition since that H&P was completed.   [name]  08/04/2012, 11:28 AM    [name]

## 2012-08-04 NOTE — Op Note (Addendum)
[facility] [address] [place], [address]   COLONOSCOPY PROCEDURE REPORT  PATIENT: Jeremy, Fowler  MR#: [id number] BIRTHDATE: 11/21/1955 , 56  yrs. old GENDER: Male ENDOSCOPIST: Louis Meckel, MD REFERRED BY: PROCEDURE DATE:  08/04/2012 PROCEDURE:   Colonoscopy with snare polypectomy and Colonoscopy with cold biopsy polypectomy ASA CLASS:   Class II INDICATIONS:rectal bleeding. MEDICATIONS: These medications were titrated to patient response per physician's verbal order, Versed 6 mg IV, Fentanyl 75 mcg IV, and Benadryl 25 mg IV  DESCRIPTION OF PROCEDURE:   After the risks benefits and alternatives of the procedure were thoroughly explained, informed consent was obtained.  A digital rectal exam revealed no abnormalities of the rectum.   The 110536  endoscope was introduced through the anus and advanced to the cecum, which was identified by both the appendix and ileocecal valve. No adverse events experienced.   The quality of the prep was Suprep excellent  The instrument was then slowly withdrawn as the colon was fully examined.      COLON FINDINGS: In the cecum there were 3 sessile polyps measuring 2-3 mm.  The smallest was removed with a cold biopsy forceps.  The 2 larger polyps were removed with cold polypectomy snare. Specimens were submitted to pathology.  polyps were found. Internal hemorrhoids were found. There was a single hyperplastic-appearing 1-2 mm polyp in the rectal vault.  The colon mucosa was otherwise normal.  Retroflexed views revealed no abnormalities. The time to cecum=  .  Withdrawal time=10 minutes 0 seconds.  The scope was withdrawn and the procedure completed. COMPLICATIONS: There were no complications.  ENDOSCOPIC IMPRESSION: 1.   colonic polyps 2.  limited rectal bleeding-likely secondary to small hemorrhoids  RECOMMENDATIONS: If the polyp(s) removed today are proven to be adenomatous (pre-cancerous) polyps, you will  need a repeat colonoscopy in 5 years.  Otherwise you should continue to follow colorectal cancer screening guidelines for "routine risk" patients with colonoscopy in 10 years.  You will receive a letter within 1-2 weeks with the results of your biopsy as well as final recommendations.  Please call my office if you have not received a letter after 3 weeks. Anusol supp as needed   eSigned:  Louis Meckel, MD 08/10/2012 10:39 AM Revised: 08/10/2012 10:39 AM  cc:

## 2012-08-05 ENCOUNTER — Encounter (HOSPITAL_COMMUNITY): Payer: Self-pay

## 2012-08-05 ENCOUNTER — Encounter (HOSPITAL_COMMUNITY): Payer: Self-pay | Admitting: Gastroenterology

## 2012-08-06 ENCOUNTER — Encounter: Payer: Self-pay | Admitting: Gastroenterology

## 2012-08-07 ENCOUNTER — Telehealth: Payer: Self-pay | Admitting: Family Medicine

## 2012-08-07 NOTE — Telephone Encounter (Signed)
error 

## 2012-08-10 ENCOUNTER — Telehealth: Payer: Self-pay | Admitting: Gastroenterology

## 2012-08-10 NOTE — Telephone Encounter (Signed)
The patient had questions regarding his colonoscopy results. He did not recall having any contact with me following the procedure.  I explained that small adenomatous polyps were removed from the cecum and that a five-year followup was recommended. I felt that his limited rectal bleeding was due to 2 internal hemorrhoids and recommended medical therapy initially. If bleeding becomes a recurrent problem despite medical therapy then band ligation could be considered. I did not think it was indicated at the time of his colonoscopy.  Patient expresses dissatisfaction with the interaction he had with our office manager stating that his complaints should be directed at the hospital at endoscopy suite. We talk this through and I agree with him that he probably should have been told that the doctor will contact him to explain the results. The patient appeared satisfied. He was instructed to contact me if he has any further questions.

## 2012-08-25 ENCOUNTER — Ambulatory Visit: Payer: BC Managed Care – PPO | Admitting: Gastroenterology

## 2013-05-01 ENCOUNTER — Other Ambulatory Visit: Payer: Self-pay | Admitting: Physician Assistant

## 2013-05-03 NOTE — Telephone Encounter (Signed)
Ok x 2  

## 2013-05-12 ENCOUNTER — Telehealth: Payer: Self-pay | Admitting: Gastroenterology

## 2013-05-12 NOTE — Telephone Encounter (Signed)
Left a message for patient to call back. 

## 2013-05-12 NOTE — Telephone Encounter (Signed)
Patient calling to report he has had a couple of episodes of rectal bleeding since colonoscopy in November. The last episode last week, took about a week to stop. He did use the anusol hc suppository nightly. He does not want to come in now or do anything new. He just wanted to let us know and he wants to be seen if this occurs again. He will call back if he has another episode.

## 2013-08-21 ENCOUNTER — Other Ambulatory Visit: Payer: Self-pay | Admitting: Emergency Medicine

## 2014-01-09 DIAGNOSIS — I10 Essential (primary) hypertension: Secondary | ICD-10-CM | POA: Insufficient documentation

## 2014-01-09 DIAGNOSIS — E785 Hyperlipidemia, unspecified: Secondary | ICD-10-CM | POA: Insufficient documentation

## 2014-01-09 DIAGNOSIS — B009 Herpesviral infection, unspecified: Secondary | ICD-10-CM | POA: Insufficient documentation

## 2014-01-09 DIAGNOSIS — G4700 Insomnia, unspecified: Secondary | ICD-10-CM | POA: Insufficient documentation

## 2014-01-12 ENCOUNTER — Encounter: Payer: Self-pay | Admitting: Internal Medicine

## 2014-02-07 ENCOUNTER — Other Ambulatory Visit: Payer: Self-pay

## 2014-02-07 MED ORDER — PRAVASTATIN SODIUM 40 MG PO TABS: 40.0000 mg | ORAL_TABLET | Freq: Every day | ORAL | Status: DC

## 2014-02-17 ENCOUNTER — Other Ambulatory Visit: Payer: Self-pay | Admitting: Dermatology

## 2014-02-18 ENCOUNTER — Ambulatory Visit (INDEPENDENT_AMBULATORY_CARE_PROVIDER_SITE_OTHER): Payer: BC Managed Care – PPO | Admitting: Physician Assistant

## 2014-02-18 ENCOUNTER — Encounter: Payer: Self-pay | Admitting: Physician Assistant

## 2014-02-18 VITALS — BP 118/78 | HR 60 | Temp 97.4°F | Resp 16 | Ht 72.5 in | Wt 216.0 lb

## 2014-02-18 DIAGNOSIS — E559 Vitamin D deficiency, unspecified: Secondary | ICD-10-CM

## 2014-02-18 DIAGNOSIS — Z0000 Encounter for general adult medical examination without abnormal findings: Secondary | ICD-10-CM

## 2014-02-18 DIAGNOSIS — Z79899 Other long term (current) drug therapy: Secondary | ICD-10-CM

## 2014-02-18 LAB — HEMOGLOBIN A1C
Hgb A1c MFr Bld: 5.6 % (ref <5.7)
Mean Plasma Glucose: 114 mg/dL (ref <117)

## 2014-02-18 NOTE — Progress Notes (Signed)
Complete Physical  Assessment and Plan: Colon polyp- continue surveillance with Dr. Deatra Ina  Prostatitis- cont follow up with Dr. Gaynelle Arabian  Hyperlipemia--continue medications, check lipids, decrease fatty foods, increase activity.   Hypertension-- continue medications, DASH diet, exercise and monitor at home. Call if greater than 130/80.   Insomnia- cont lunesta  HSV-2 infection- cont acyclovir  Health Maintenance  Discussed med's effects and SE's. Screening labs and tests as requested with regular follow-up as recommended.  HPI 58 y.o. male  presents for a complete physical. His blood pressure has been controlled at home, today their BP is BP: 118/78 mmHg He does workout. He denies chest pain, shortness of breath, dizziness.  He is on cholesterol medication and denies myalgias. His cholesterol is not at goal. The cholesterol last visit was:  LDL 115, trigs 235, HDL 38 Last A1C in the office was: 5.5 Patient is on Vitamin D supplement. Has seens Dr. Tonia Brooms for a nodule on his right nostril, waiting pathology.  Has seen Dr. Gaynelle Arabian recently, diagnosed with ICS and he checks his prostate.  He gets an EKG once a year with Dr. Rosana Hoes for his [place] physical.  Current Medications:  Current Outpatient Prescriptions on File Prior to Visit  Medication Sig Dispense Refill  . acyclovir (ZOVIRAX) 400 MG tablet Take 400 mg by mouth 2 (two) times daily.      . Eszopiclone 3 MG TABS TAKE ONE TABLET BY MOUTH EVERY DAY  30 tablet  0  . pravastatin (PRAVACHOL) 40 MG tablet Take 1 tablet (40 mg total) by mouth daily.  90 tablet  3   No current facility-administered medications on file prior to visit.   Health Maintenance:  Immunization History  Administered Date(s) Administered  . Td 01/22/2007   Tetanus: 2008 Pneumovax: N/A Flu vaccine: 2014 Zostavax: N/A DEXA: N/A Colonoscopy: 2013 Deatra Ina) - due 10 years EGD: N/A DEE, Dr. Bing Plume- yearly  Patient Care Team: Unk Pinto, MD as PCP -  General (Internal Medicine) Inda Castle, MD as Consulting Physician (Gastroenterology) Ailene Rud, MD as Consulting Physician (Urology) Marybelle Killings, MD as Consulting Physician (Orthopedic Surgery) [facility] Myrtice Lauth, MD as Consulting Physician (Dermatology)   Allergies:  Allergies  Allergen Reactions  . Sulfa Antibiotics     dizzy   Medical History:  Past Medical History  Diagnosis Date  . Colon polyp 04-28-2002    Colonoscopy   . Prostatitis   . Hyperlipemia   . Hypertension   . Insomnia   . HSV-2 infection    Surgical History:  Past Surgical History  Procedure Laterality Date  . Calcaneal osteotomy w/ internal fixation    . Colonoscopy  08/04/2012    Procedure: COLONOSCOPY;  Surgeon: Inda Castle, MD;  Location: [facility] ENDOSCOPY;  Service: Endoscopy;  Laterality: N/A;  with possible banding   Family History:  Family History  Problem Relation Age of Onset  . Colon cancer Maternal Grandmother    Social History:   History  Substance Use Topics  . Smoking status: Light Tobacco Smoker    Types: Cigars  . Smokeless tobacco: Never Used  . Alcohol Use: 1.2 oz/week    2 Glasses of wine per week   ROS:  [X]  = complains of  [ ]  = denies  General: Fatigue [ ]  Fever [ ]  Chills [ ]  Weakness [ ]   Insomnia [ ]  Weight change [ ]  Night sweats [ ]   Change in appetite [ ]  Eyes: Redness [ ]  Blurred vision [ ]  Diplopia [ ]   Discharge [ ]   ENT: Congestion [ ]  Sinus Pain [ ]  Post Nasal Drip [ ]  Sore Throat [ ]  Earache [ ]  hearing loss [ ]  Tinnitus [ ]  Snoring [ ]   Cardiac: Chest pain/pressure [ ]  SOB [ ]  Orthopnea [ ]   Palpitations [ ]   Paroxysmal nocturnal dyspnea[ ]  Claudication [ ]  Edema [ ]   Pulmonary: Cough [ ]  Wheezing[ ]   SOB [ ]   Pleurisy [ ]   GI: Nausea [ ]  Vomiting[ ]  Dysphagia[ ]  Heartburn[ ]  Abdominal pain [ ]  Constipation [ ] ; Diarrhea [ ]  BRBPR [ ]  Melena[ ]  Bloating [ ]  Hemorrhoids [ ]   GU: Hematuria[ ]  Dysuria [ ]  Nocturia[ ]  Urgency [ ]   Hesitancy [ ]   Discharge [ ]  Frequency [ ]   Neuro: Headaches[ ]  Vertigo[ ]  Paresthesias[ ]  Spasm [ ]  Speech changes [ ]  Incoordination [ ]   Ortho: Arthritis [ ]  Joint pain [ ]  Muscle pain [ ]  Joint swelling [ ]  Back Pain [ ]  Skin:  Rash [ ]   Pruritis [ ]  Change in skin lesion [ ]   Psych: Depression[ ]  Anxiety[ ]  Confusion [ ]  Memory loss [ ]   Heme/Lymph: Bleeding [ ]  Bruising [ ]  Enlarged lymph nodes [ ]   Endocrine: Visual blurring [ ]  Paresthesia [ ]  Polyuria [ ]  Polydypsea [ ]    Heat/cold intolerance [ ]  Hypoglycemia [ ]   Physical Exam: Estimated body mass index is 28.88 kg/(m^2) as calculated from the following:   Height as of this encounter: 6' 0.5" (1.842 m).   Weight as of this encounter: 216 lb (97.977 kg). BP 118/78  Pulse 60  Temp(Src) 97.4 F (36.3 C)  Resp 16  Ht 6' 0.5" (1.842 m)  Wt 216 lb (97.977 kg)  BMI 28.88 kg/m2 General Appearance: Well nourished, in no apparent distress. Eyes: PERRLA, EOMs, conjunctiva no swelling or erythema, normal fundi and vessels. Sinuses: No Frontal/maxillary tenderness ENT/Mouth: Ext aud canals clear, normal light reflex with TMs without erythema, bulging. Good dentition. No erythema, swelling, or exudate on post pharynx. Tonsils not swollen or erythematous. Hearing normal.  Neck: Supple, thyroid normal. No bruits Respiratory: Respiratory effort normal, BS equal bilaterally without rales, rhonchi, wheezing or stridor. Cardio: RRR without murmurs, rubs or gallops. Brisk peripheral pulses without edema.  Chest: symmetric, with normal excursions and percussion. Abdomen: Soft, +BS. Non tender, no guarding, rebound, hernias, masses, or organomegaly. .  Lymphatics: Non tender without lymphadenopathy.  Genitourinary: defer Musculoskeletal: Full ROM all peripheral extremities,5/5 strength, and normal gait. Skin: Warm, dry without rashes, lesions, ecchymosis.  Neuro: Cranial nerves intact, reflexes equal bilaterally. Normal muscle tone, no cerebellar symptoms.  Sensation intact.  Psych: Awake and oriented X 3, normal affect, Insight and Judgment appropriate.   EKG: WNL no changes.  Vicie Mutters 9:43 AM

## 2014-02-18 NOTE — Patient Instructions (Signed)
Preventative Care for Adults, Male       REGULAR HEALTH EXAMS:  A routine yearly physical is a good way to check in with your primary care provider about your health and preventive screening. It is also an opportunity to share updates about your health and any concerns you have, and receive a thorough all-over exam.   Most health insurance companies pay for at least some preventative services.  Check with your health plan for specific coverages.  WHAT PREVENTATIVE SERVICES DO MEN NEED?  Adult men should have their weight and blood pressure checked regularly.   Men age 35 and older should have their cholesterol levels checked regularly.  Beginning at age 50 and continuing to age 75, men should be screened for colorectal cancer.  Certain people should may need continued testing until age 85.  Other cancer screening may include exams for testicular and prostate cancer.  Updating vaccinations is part of preventative care.  Vaccinations help protect against diseases such as the flu.  Lab tests are generally done as part of preventative care to screen for anemia and blood disorders, to screen for problems with the kidneys and liver, to screen for bladder problems, to check blood sugar, and to check your cholesterol level.  Preventative services generally include counseling about diet, exercise, avoiding tobacco, drugs, excessive alcohol consumption, and sexually transmitted infections.    GENERAL RECOMMENDATIONS FOR GOOD HEALTH:  Healthy diet:  Eat a variety of foods, including fruit, vegetables, animal or vegetable protein, such as meat, fish, chicken, and eggs, or beans, lentils, tofu, and grains, such as rice.  Drink plenty of water daily.  Decrease saturated fat in the diet, avoid lots of red meat, processed foods, sweets, fast foods, and fried foods.  Exercise:  Aerobic exercise helps maintain good heart health. At least 30-40 minutes of moderate-intensity exercise is recommended.  For example, a brisk walk that increases your heart rate and breathing. This should be done on most days of the week.   Find a type of exercise or a variety of exercises that you enjoy so that it becomes a part of your daily life.  Examples are running, walking, swimming, water aerobics, and biking.  For motivation and support, explore group exercise such as aerobic class, spin class, Zumba, Yoga,or  martial arts, etc.    Set exercise goals for yourself, such as a certain weight goal, walk or run in a race such as a 5k walk/run.  Speak to your primary care provider about exercise goals.  Disease prevention:  If you smoke or chew tobacco, find out from your caregiver how to quit. It can literally save your life, no matter how long you have been a tobacco user. If you do not use tobacco, never begin.   Maintain a healthy diet and normal weight. Increased weight leads to problems with blood pressure and diabetes.   The Body Mass Index or BMI is a way of measuring how much of your body is fat. Having a BMI above 27 increases the risk of heart disease, diabetes, hypertension, stroke and other problems related to obesity. Your caregiver can help determine your BMI and based on it develop an exercise and dietary program to help you achieve or maintain this important measurement at a healthful level.  High blood pressure causes heart and blood vessel problems.  Persistent high blood pressure should be treated with medicine if weight loss and exercise do not work.   Fat and cholesterol leaves deposits in your arteries   that can block them. This causes heart disease and vessel disease elsewhere in your body.  If your cholesterol is found to be high, or if you have heart disease or certain other medical conditions, then you may need to have your cholesterol monitored frequently and be treated with medication.   Ask if you should have a stress test if your history suggests this. A stress test is a test done on  a treadmill that looks for heart disease. This test can find disease prior to there being a problem.  Avoid drinking alcohol in excess (more than two drinks per day).  Avoid use of street drugs. Do not share needles with anyone. Ask for professional help if you need assistance or instructions on stopping the use of alcohol, cigarettes, and/or drugs.  Brush your teeth twice a day with fluoride toothpaste, and floss once a day. Good oral hygiene prevents tooth decay and gum disease. The problems can be painful, unattractive, and can cause other health problems. Visit your dentist for a routine oral and dental check up and preventive care every 6-12 months.   Look at your skin regularly.  Use a mirror to look at your back. Notify your caregivers of changes in moles, especially if there are changes in shapes, colors, a size larger than a pencil eraser, an irregular border, or development of new moles.  Safety:  Use seatbelts 100% of the time, whether driving or as a passenger.  Use safety devices such as hearing protection if you work in environments with loud noise or significant background noise.  Use safety glasses when doing any work that could send debris in to the eyes.  Use a helmet if you ride a bike or motorcycle.  Use appropriate safety gear for contact sports.  Talk to your caregiver about gun safety.  Use sunscreen with a SPF (or skin protection factor) of 15 or greater.  Lighter skinned people are at a greater risk of skin cancer. Don't forget to also wear sunglasses in order to protect your eyes from too much damaging sunlight. Damaging sunlight can accelerate cataract formation.   Practice safe sex. Use condoms. Condoms are used for birth control and to help reduce the spread of sexually transmitted infections (or STIs).  Some of the STIs are gonorrhea (the clap), chlamydia, syphilis, trichomonas, herpes, HPV (human papilloma virus) and HIV (human immunodeficiency virus) which causes AIDS.  The herpes, HIV and HPV are viral illnesses that have no cure. These can result in disability, cancer and death.   Keep carbon monoxide and smoke detectors in your home functioning at all times. Change the batteries every 6 months or use a model that plugs into the wall.   Vaccinations:  Stay up to date with your tetanus shots and other required immunizations. You should have a booster for tetanus every 10 years. Be sure to get your flu shot every year, since 5%-20% of the [place] population comes down with the flu. The flu vaccine changes each year, so being vaccinated once is not enough. Get your shot in the fall, before the flu season peaks.   Other vaccines to consider:  Pneumococcal vaccine to protect against certain types of pneumonia.  This is normally recommended for adults age 65 or older.  However, adults younger than 58 years old with certain underlying conditions such as diabetes, heart or lung disease should also receive the vaccine.  Shingles vaccine to protect against Varicella Zoster if you are older than age 60, or younger   than 58 years old with certain underlying illness.  Hepatitis A vaccine to protect against a form of infection of the liver by a virus acquired from food.  Hepatitis B vaccine to protect against a form of infection of the liver by a virus acquired from blood or body fluids, particularly if you work in health care.  If you plan to travel internationally, check with your local health department for specific vaccination recommendations.  Cancer Screening:  Most routine colon cancer screening begins at the age of 50. On a yearly basis, doctors may provide special easy to use take-home tests to check for hidden blood in the stool. Sigmoidoscopy or colonoscopy can detect the earliest forms of colon cancer and is life saving. These tests use a small camera at the end of a tube to directly examine the colon. Speak to your caregiver about this at age 50, when routine  screening begins (and is repeated every 5 years unless early forms of pre-cancerous polyps or small growths are found).   At the age of 50 men usually start screening for prostate cancer every year. Screening may begin at a younger age for those with higher risk. Those at higher risk include African-Americans or having a family history of prostate cancer. There are two types of tests for prostate cancer:   Prostate-specific antigen (PSA) testing. Recent studies raise questions about prostate cancer using PSA and you should discuss this with your caregiver.   Digital rectal exam (in which your doctor's lubricated and gloved finger feels for enlargement of the prostate through the anus).   Screening for testicular cancer.  Do a monthly exam of your testicles. Gently roll each testicle between your thumb and fingers, feeling for any abnormal lumps. The best time to do this is after a hot shower or bath when the tissues are looser. Notify your caregivers of any lumps, tenderness or changes in size or shape immediately.     

## 2014-02-19 LAB — CBC WITH DIFFERENTIAL/PLATELET
BASOS ABS: 0.1 10*3/uL (ref 0.0–0.1)
BASOS PCT: 1 % (ref 0–1)
EOS ABS: 0.1 10*3/uL (ref 0.0–0.7)
EOS PCT: 2 % (ref 0–5)
HCT: 45.2 % (ref 39.0–52.0)
HEMOGLOBIN: 15.9 g/dL (ref 13.0–17.0)
LYMPHS PCT: 33 % (ref 12–46)
Lymphs Abs: 1.9 10*3/uL (ref 0.7–4.0)
MCH: 32 pg (ref 26.0–34.0)
MCHC: 35.2 g/dL (ref 30.0–36.0)
MCV: 90.9 fL (ref 78.0–100.0)
MONO ABS: 0.4 10*3/uL (ref 0.1–1.0)
Monocytes Relative: 7 % (ref 3–12)
Neutro Abs: 3.2 10*3/uL (ref 1.7–7.7)
Neutrophils Relative %: 57 % (ref 43–77)
PLATELETS: 177 10*3/uL (ref 150–400)
RBC: 4.97 MIL/uL (ref 4.22–5.81)
RDW: 13.5 % (ref 11.5–15.5)
WBC: 5.7 10*3/uL (ref 4.0–10.5)

## 2014-02-19 LAB — MICROALBUMIN / CREATININE URINE RATIO
CREATININE, URINE: 42.8 mg/dL
Microalb Creat Ratio: 11.7 mg/g (ref 0.0–30.0)
Microalb, Ur: 0.5 mg/dL (ref 0.00–1.89)

## 2014-02-19 LAB — URINALYSIS, ROUTINE W REFLEX MICROSCOPIC
Bilirubin Urine: NEGATIVE
Glucose, UA: NEGATIVE mg/dL
HGB URINE DIPSTICK: NEGATIVE
KETONES UR: NEGATIVE mg/dL
LEUKOCYTES UA: NEGATIVE
NITRITE: NEGATIVE
Protein, ur: NEGATIVE mg/dL
SPECIFIC GRAVITY, URINE: 1.009 (ref 1.005–1.030)
UROBILINOGEN UA: 0.2 mg/dL (ref 0.0–1.0)
pH: 6 (ref 5.0–8.0)

## 2014-02-19 LAB — BASIC METABOLIC PANEL WITH GFR
BUN: 17 mg/dL (ref 6–23)
CALCIUM: 9.6 mg/dL (ref 8.4–10.5)
CHLORIDE: 100 meq/L (ref 96–112)
CO2: 27 mEq/L (ref 19–32)
CREATININE: 0.94 mg/dL (ref 0.50–1.35)
Glucose, Bld: 87 mg/dL (ref 70–99)
Potassium: 4.1 mEq/L (ref 3.5–5.3)
Sodium: 137 mEq/L (ref 135–145)

## 2014-02-19 LAB — VITAMIN B12: Vitamin B-12: 708 pg/mL (ref 211–911)

## 2014-02-19 LAB — VITAMIN D 25 HYDROXY (VIT D DEFICIENCY, FRACTURES): VIT D 25 HYDROXY: 64 ng/mL (ref 30–89)

## 2014-02-19 LAB — LIPID PANEL
CHOL/HDL RATIO: 3.9 ratio
CHOLESTEROL: 168 mg/dL (ref 0–200)
HDL: 43 mg/dL (ref >39)
LDL Cholesterol: 95 mg/dL (ref 0–99)
Triglycerides: 149 mg/dL (ref <150)
VLDL: 30 mg/dL (ref 0–40)

## 2014-02-19 LAB — HEPATIC FUNCTION PANEL
ALBUMIN: 4.5 g/dL (ref 3.5–5.2)
ALK PHOS: 73 U/L (ref 39–117)
ALT: 38 U/L (ref 0–53)
AST: 25 U/L (ref 0–37)
BILIRUBIN TOTAL: 0.9 mg/dL (ref 0.2–1.2)
Bilirubin, Direct: 0.2 mg/dL (ref 0.0–0.3)
Indirect Bilirubin: 0.7 mg/dL (ref 0.2–1.2)
TOTAL PROTEIN: 7.5 g/dL (ref 6.0–8.3)

## 2014-02-19 LAB — IRON AND TIBC
%SAT: 28 % (ref 20–55)
IRON: 92 ug/dL (ref 42–165)
TIBC: 333 ug/dL (ref 215–435)
UIBC: 241 ug/dL (ref 125–400)

## 2014-02-19 LAB — MAGNESIUM: Magnesium: 1.9 mg/dL (ref 1.5–2.5)

## 2014-02-19 LAB — FERRITIN: FERRITIN: 321 ng/mL (ref 22–322)

## 2014-02-19 LAB — INSULIN, FASTING: INSULIN FASTING, SERUM: 11 u[IU]/mL (ref 3–28)

## 2014-02-19 LAB — TSH: TSH: 1.44 u[IU]/mL (ref 0.350–4.500)

## 2014-02-25 ENCOUNTER — Encounter: Payer: Self-pay | Admitting: Physician Assistant

## 2014-02-25 ENCOUNTER — Other Ambulatory Visit: Payer: Self-pay | Admitting: Emergency Medicine

## 2014-02-26 MED ORDER — ACYCLOVIR 400 MG PO TABS: 400.0000 mg | ORAL_TABLET | Freq: Two times a day (BID) | ORAL | Status: DC

## 2014-03-03 ENCOUNTER — Other Ambulatory Visit: Payer: Self-pay | Admitting: *Deleted

## 2014-03-03 MED ORDER — PRAVASTATIN SODIUM 40 MG PO TABS: 40.0000 mg | ORAL_TABLET | Freq: Every day | ORAL | Status: DC

## 2014-03-29 ENCOUNTER — Telehealth: Payer: Self-pay | Admitting: *Deleted

## 2014-03-29 NOTE — Telephone Encounter (Signed)
Patient called and questioned if he had a dx of degenerative disk disease.  Patient applying for life insurance and asked Dr Melford Aase to write a note saying he does not have DDD. He thinks he does have the disease since his posture is correct.  Per Dr Melford Aase, DDD confirmed on an X-ray in 2013. OK to send patient a copy of x-ray.  Patient aware.

## 2014-05-05 ENCOUNTER — Telehealth: Payer: Self-pay | Admitting: *Deleted

## 2014-05-05 NOTE — Telephone Encounter (Signed)
Patient states he has been taking Pravastatin for several years and has developed muscle aches, especially in his legs.  Per Dr Melford Aase, come in for a lab only to check his CPK and Aldolase-DX-729.5 and after lab is drawn, stop Pravastatin. until results come back.

## 2014-05-06 ENCOUNTER — Other Ambulatory Visit: Payer: BC Managed Care – PPO

## 2014-05-06 DIAGNOSIS — M79609 Pain in unspecified limb: Secondary | ICD-10-CM

## 2014-05-07 LAB — CK: CK TOTAL: 66 U/L (ref 7–232)

## 2014-05-07 LAB — ALDOLASE: Aldolase: 6.9 U/L (ref <=8.1)

## 2014-05-31 ENCOUNTER — Encounter: Payer: Self-pay | Admitting: *Deleted

## 2014-09-30 HISTORY — PX: HEMORRHOID BANDING: SHX5850

## 2014-11-09 ENCOUNTER — Encounter: Payer: Self-pay | Admitting: Podiatry

## 2014-11-09 ENCOUNTER — Ambulatory Visit (INDEPENDENT_AMBULATORY_CARE_PROVIDER_SITE_OTHER): Payer: BLUE CROSS/BLUE SHIELD | Admitting: Podiatry

## 2014-11-09 VITALS — BP 140/93 | HR 71 | Resp 16

## 2014-11-09 DIAGNOSIS — B351 Tinea unguium: Secondary | ICD-10-CM

## 2014-11-09 DIAGNOSIS — M2040 Other hammer toe(s) (acquired), unspecified foot: Secondary | ICD-10-CM

## 2014-11-09 DIAGNOSIS — L600 Ingrowing nail: Secondary | ICD-10-CM

## 2014-11-09 NOTE — Patient Instructions (Signed)

## 2014-11-09 NOTE — Progress Notes (Signed)
Subjective:     Patient ID: Jeremy Fowler, male   DOB: 05/23/1956, 59 y.o.   MRN: [id number]  HPI patient states I have a painful ingrown toenail my right big toe and some fungus on my left big toe which is been present for a while   Review of Systems  All other systems reviewed and are negative.      Objective:   Physical Exam  Constitutional: He is oriented to person, place, and time.  Cardiovascular: Intact distal pulses.   Musculoskeletal: Normal range of motion.  Neurological: He is oriented to person, place, and time.  Skin: Skin is warm.  Nursing note and vitals reviewed.  neurovascular status intact with muscle strength adequate range of motion subtalar midtarsal joint within more normal limits patient's noted to have an incurvated right hallux nail medial border that's painful when pressed and yellow discoloration of the left hallux distal one third medial side. Patient's digits are well-perfused and well oriented 3 with no indication of equinus condition     Assessment:     Ingrown toenail deformity right hallux medial border with fungus left hallux nail but    Plan:     H&P and conditions discussed and recommended correction of the ingrown toenail. Explained procedure and risk and today infiltrated 60 mg Xylocaine Marcaine mixture remove the medial border exposed matrix and applied phenol 3 applications 30 seconds followed by alcohol lavage and sterile dressing for the left hallux begin formula 3 usage and I explained will take 4-6 months to heal and I did explain soaks for the right and reappoint as needed

## 2014-11-09 NOTE — Progress Notes (Signed)
   Subjective:    Patient ID: Jeremy Fowler, male    DOB: Aug 02, 1956, 59 y.o.   MRN: [id number]  HPI Comments: "I have a toenail fungus and a sore toe"  Patient c/o thick, discolored 1st toenail left, medial border and an ingrown toenail 1st toenail right, medial border.        Review of Systems  All other systems reviewed and are negative.      Objective:   Physical Exam        Assessment & Plan:

## 2014-12-09 DIAGNOSIS — M79673 Pain in unspecified foot: Secondary | ICD-10-CM

## 2014-12-22 ENCOUNTER — Encounter: Payer: Self-pay | Admitting: Internal Medicine

## 2014-12-22 ENCOUNTER — Ambulatory Visit (INDEPENDENT_AMBULATORY_CARE_PROVIDER_SITE_OTHER): Payer: BLUE CROSS/BLUE SHIELD | Admitting: Internal Medicine

## 2014-12-22 VITALS — BP 128/86 | HR 62 | Temp 98.0°F | Resp 18 | Ht 72.5 in | Wt 219.0 lb

## 2014-12-22 DIAGNOSIS — K644 Residual hemorrhoidal skin tags: Secondary | ICD-10-CM

## 2014-12-22 DIAGNOSIS — K648 Other hemorrhoids: Secondary | ICD-10-CM

## 2014-12-22 DIAGNOSIS — K6289 Other specified diseases of anus and rectum: Secondary | ICD-10-CM

## 2014-12-22 MED ORDER — HYDROCORTISONE 2.5 % RE CREA: 1.0000 "application " | TOPICAL_CREAM | Freq: Two times a day (BID) | RECTAL | Status: DC

## 2014-12-22 NOTE — Progress Notes (Signed)
   Subjective:    Patient ID: Jeremy Fowler, male    DOB: 1955/11/21, 59 y.o.   MRN: [id number]  HPI  Patient reports that 2 weeks ago he developed some rectal discomfort approximately two weeks ago after a 10 mile walk.  He reports that he had a red rash on his buttocks for 2 weeks.  He was using zinc and then started using some preparation H which was helping with the discomfort.  He reports that he has a history of hemorrhoids in the past.  This does not feel that this is similar to the hemorrhoids in the past.  He reports his discomfort was a 3.5/10.  He reports that he has a history of rectal bleeding.  He reports that the last episode of rectal bleeding was in January.  He reports that he will have it in 3-4 bowel movements and then it will go away.  It is bright red in nature.  He feels that the stool is coated with blood.    Review of Systems  Constitutional: Negative for fever, chills and fatigue.  Gastrointestinal: Negative for nausea, vomiting, abdominal pain, diarrhea, constipation, blood in stool and anal bleeding.  Genitourinary: Negative.   Musculoskeletal: Negative.   Skin: Positive for rash.       Objective:   Physical Exam  Constitutional: He is oriented to person, place, and time. He appears well-developed and well-nourished. No distress.  HENT:  Head: Normocephalic and atraumatic.  Mouth/Throat: Oropharynx is clear and moist. No oropharyngeal exudate.  Eyes: Conjunctivae and EOM are normal. Pupils are equal, round, and reactive to light. No scleral icterus.  Neck: Normal range of motion. Neck supple. No JVD present. No thyromegaly present.  Cardiovascular: Normal rate, regular rhythm, normal heart sounds and intact distal pulses.  Exam reveals no gallop and no friction rub.   No murmur heard. Pulmonary/Chest: Effort normal and breath sounds normal. No respiratory distress. He has no wheezes. He has no rales. He exhibits no tenderness.  Genitourinary: Rectal exam shows  external hemorrhoid. Rectal exam shows no internal hemorrhoid, no fissure, no mass, no tenderness and anal tone normal. Guaiac negative stool.     Musculoskeletal: Normal range of motion.  Lymphadenopathy:    He has no cervical adenopathy.  Neurological: He is alert and oriented to person, place, and time.  Skin: Skin is warm and dry. He is not diaphoretic.  Psychiatric: He has a normal mood and affect. His behavior is normal. Judgment and thought content normal.  Nursing note and vitals reviewed.         Assessment & Plan:    1. External hemorrhoid -soft non-thrombosed non-bleeding -negative hemoccult   2. Irritation of perirectal skin -given history likely chafing.  There is no evidence of rash on exam today -try annusol cream on rash and also on hemorrhoid -use body glide when exercising.

## 2014-12-22 NOTE — Patient Instructions (Signed)

## 2015-01-14 ENCOUNTER — Other Ambulatory Visit: Payer: Self-pay | Admitting: Internal Medicine

## 2015-02-09 ENCOUNTER — Encounter: Payer: Self-pay | Admitting: Internal Medicine

## 2015-02-09 ENCOUNTER — Ambulatory Visit (INDEPENDENT_AMBULATORY_CARE_PROVIDER_SITE_OTHER): Payer: BLUE CROSS/BLUE SHIELD | Admitting: Internal Medicine

## 2015-02-09 VITALS — BP 132/82 | HR 64 | Temp 97.7°F | Resp 16 | Ht 72.5 in | Wt 219.0 lb

## 2015-02-09 DIAGNOSIS — K648 Other hemorrhoids: Secondary | ICD-10-CM

## 2015-02-09 MED ORDER — HYDROCORTISONE 2.5 % RE CREA: 1.0000 "application " | TOPICAL_CREAM | Freq: Two times a day (BID) | RECTAL | Status: DC

## 2015-02-09 MED ORDER — HYDROCORTISONE ACETATE 25 MG RE SUPP: RECTAL | Status: DC

## 2015-02-09 NOTE — Patient Instructions (Signed)

## 2015-02-09 NOTE — Progress Notes (Signed)
   Subjective:    Patient ID: Jeremy Fowler, male    DOB: 10-03-1955, 59 y.o.   MRN: [id number]  HPI Presents with c/o Int hemorrhoids with intermittent bleeding.  Last colon Dec 2013 by Dr Deatra Ina did show small int. Hemorrhoids.  Review of Systems 10 point systems review negative except as above.    Objective:   Physical Exam BP 132/82 mmHg  Pulse 64  Temp(Src) 97.7 F (36.5 C)  Resp 16  Ht 6' 0.5" (1.842 m)  Wt 219 lb (99.338 kg)  BMI 29.28 kg/m2  DRE finds Nl Prostate with small IH at 6 o'clock. Hemoccult Neg.    Assessment & Plan:   1. Internal hemorrhoid, bleeding  - hydrocortisone (ANUSOL-HC) 25 MG suppository; Insert 1 suppository rectally 3 to 4 x daily  Dispense: 24 suppository; Refill: 3  - Advised if sx's/bleeding persists , call back for referral back to Dr Saddie Benders al for consideration of injection sclerotherapy.

## 2015-02-22 ENCOUNTER — Encounter: Payer: Self-pay | Admitting: Physician Assistant

## 2015-03-17 ENCOUNTER — Encounter: Payer: Self-pay | Admitting: Physician Assistant

## 2015-03-17 ENCOUNTER — Ambulatory Visit (INDEPENDENT_AMBULATORY_CARE_PROVIDER_SITE_OTHER): Payer: BLUE CROSS/BLUE SHIELD | Admitting: Physician Assistant

## 2015-03-17 VITALS — BP 138/82 | HR 60 | Temp 97.7°F | Resp 16 | Ht 72.5 in | Wt 219.0 lb

## 2015-03-17 DIAGNOSIS — Z0000 Encounter for general adult medical examination without abnormal findings: Secondary | ICD-10-CM

## 2015-03-17 DIAGNOSIS — Z1159 Encounter for screening for other viral diseases: Secondary | ICD-10-CM

## 2015-03-17 DIAGNOSIS — Z79899 Other long term (current) drug therapy: Secondary | ICD-10-CM

## 2015-03-17 DIAGNOSIS — I10 Essential (primary) hypertension: Secondary | ICD-10-CM

## 2015-03-17 DIAGNOSIS — K648 Other hemorrhoids: Secondary | ICD-10-CM

## 2015-03-17 DIAGNOSIS — E559 Vitamin D deficiency, unspecified: Secondary | ICD-10-CM | POA: Insufficient documentation

## 2015-03-17 DIAGNOSIS — B009 Herpesviral infection, unspecified: Secondary | ICD-10-CM

## 2015-03-17 DIAGNOSIS — N400 Enlarged prostate without lower urinary tract symptoms: Secondary | ICD-10-CM

## 2015-03-17 DIAGNOSIS — Z800 Family history of malignant neoplasm of digestive organs: Secondary | ICD-10-CM

## 2015-03-17 DIAGNOSIS — E785 Hyperlipidemia, unspecified: Secondary | ICD-10-CM

## 2015-03-17 DIAGNOSIS — F17200 Nicotine dependence, unspecified, uncomplicated: Secondary | ICD-10-CM | POA: Insufficient documentation

## 2015-03-17 DIAGNOSIS — Z86010 Personal history of colonic polyps: Secondary | ICD-10-CM

## 2015-03-17 DIAGNOSIS — Z85828 Personal history of other malignant neoplasm of skin: Secondary | ICD-10-CM | POA: Insufficient documentation

## 2015-03-17 DIAGNOSIS — Z13228 Encounter for screening for other metabolic disorders: Secondary | ICD-10-CM

## 2015-03-17 DIAGNOSIS — G4700 Insomnia, unspecified: Secondary | ICD-10-CM

## 2015-03-17 DIAGNOSIS — Z6829 Body mass index (BMI) 29.0-29.9, adult: Secondary | ICD-10-CM

## 2015-03-17 HISTORY — DX: Other long term (current) drug therapy: Z79.899

## 2015-03-17 LAB — CBC WITH DIFFERENTIAL/PLATELET
BASOS PCT: 1 % (ref 0–1)
Basophils Absolute: 0.1 10*3/uL (ref 0.0–0.1)
EOS ABS: 0.2 10*3/uL (ref 0.0–0.7)
EOS PCT: 3 % (ref 0–5)
HEMATOCRIT: 45.4 % (ref 39.0–52.0)
HEMOGLOBIN: 15.8 g/dL (ref 13.0–17.0)
LYMPHS ABS: 1.8 10*3/uL (ref 0.7–4.0)
Lymphocytes Relative: 33 % (ref 12–46)
MCH: 33.3 pg (ref 26.0–34.0)
MCHC: 34.8 g/dL (ref 30.0–36.0)
MCV: 95.8 fL (ref 78.0–100.0)
MONOS PCT: 9 % (ref 3–12)
MPV: 10.9 fL (ref 8.6–12.4)
Monocytes Absolute: 0.5 10*3/uL (ref 0.1–1.0)
Neutro Abs: 3 10*3/uL (ref 1.7–7.7)
Neutrophils Relative %: 54 % (ref 43–77)
Platelets: 196 10*3/uL (ref 150–400)
RBC: 4.74 MIL/uL (ref 4.22–5.81)
RDW: 13.5 % (ref 11.5–15.5)
WBC: 5.5 10*3/uL (ref 4.0–10.5)

## 2015-03-17 LAB — HEMOGLOBIN A1C
HEMOGLOBIN A1C: 5.4 % (ref <5.7)
MEAN PLASMA GLUCOSE: 108 mg/dL (ref <117)

## 2015-03-17 MED ORDER — NYSTATIN 100000 UNIT/GM EX POWD
CUTANEOUS | Status: DC
Start: 2015-03-17 — End: 2016-02-27

## 2015-03-17 MED ORDER — ACYCLOVIR 400 MG PO TABS: 400.0000 mg | ORAL_TABLET | Freq: Two times a day (BID) | ORAL | Status: DC

## 2015-03-17 MED ORDER — ALFUZOSIN HCL ER 10 MG PO TB24: 10.0000 mg | ORAL_TABLET | Freq: Every day | ORAL | Status: DC

## 2015-03-17 MED ORDER — CLOTRIMAZOLE-BETAMETHASONE 1-0.05 % EX CREA: 1.0000 "application " | TOPICAL_CREAM | Freq: Two times a day (BID) | CUTANEOUS | Status: DC

## 2015-03-17 MED ORDER — ESZOPICLONE 3 MG PO TABS: ORAL_TABLET | ORAL | Status: DC

## 2015-03-17 MED ORDER — HYDROCORTISONE ACETATE 25 MG RE SUPP: RECTAL | Status: DC

## 2015-03-17 NOTE — Progress Notes (Signed)
Complete Physical  Assessment and Plan: 1. Essential hypertension - continue medications, DASH diet, exercise and monitor at home. Call if greater than 130/80.  - CBC with Differential/Platelet - BASIC METABOLIC PANEL WITH GFR - Hepatic function panel - TSH - Urinalysis, Routine w reflex microscopic (not at [facility]) - Microalbumin / creatinine urine ratio  2. Hyperlipemia -continue medications but may decrease pravastatin to 40mg  1/2 pill QOD OR may switch to crestor, wants least amount of medications possible  check lipids, decrease fatty foods, increase activity.  - Lipid panel - Hemoglobin A1c - Insulin, fasting  3. BPH (benign prostatic hyperplasia) Continue medications  4. History of basal cell carcinoma Follow up with Dr. Kalman Shan  5. Insomnia lunesta refilled  6. Hx of colonic polyps Due 10 years  7. HSV-2 infection Refill acyclovir  8. Internal hemorrhoids Refill suppository, follow up Dr. Deatra Ina - hydrocortisone (ANUSOL-HC) 25 MG suppository; Insert 1 suppository rectally 3 to 4 x daily  Dispense: 12 suppository; Refill: 3  9. Family hx of colon cancer monitor  10. Vitamin D deficiency - Vit D  25 hydroxy (rtn osteoporosis monitoring)  11. Medication management - Magnesium  12. Routine general medical examination at a health care facility  13. Tobacco use disorder Suggest stopping  14. Screening for viral disease - HIV antibody - Hepatitis C antibody - RPR  Discussed med's effects and SE's. Screening labs and tests as requested with regular follow-up as recommended.  HPI 59 y.o. male  presents for a complete physical. His blood pressure has been controlled at home, today their BP is BP: 138/82 mmHg He does workout, elliptical, weight lifting. He denies chest pain, shortness of breath, dizziness. He gets an EKG once a year with Dr. Rosana Hoes for his [place] physical. Declines aorta scan, will get at age 63 due to cigar use.  He is on cholesterol  medication, pravastatin 40 mg every other day and denies myalgias with doing it every other day, had negative CPK in august. His cholesterol is not at goal. The cholesterol last visit was:   Lab Results  Component Value Date   CHOL 168 02/18/2014   HDL 43 02/18/2014   LDLCALC 95 02/18/2014   TRIG 149 02/18/2014   CHOLHDL 3.9 02/18/2014   Lab Results  Component Value Date   CKTOTAL 66 05/06/2014   Last A1C in the office was:  Lab Results  Component Value Date   HGBA1C 5.6 02/18/2014   Patient is on Vitamin D supplement. Lab Results  Component Value Date   VD25OH 64 02/18/2014   Has seens Dr. Tonia Brooms [facility] on nose.  Has seen Dr. Gaynelle Arabian recently, diagnosed with ICS and he checks his prostate, he is on alfuzosin.  BMI is Body mass index is 29.28 kg/(m^2)., he is working on diet and exercise. Wt Readings from Last 3 Encounters:  03/17/15 219 lb (99.338 kg)  02/09/15 219 lb (99.338 kg)  12/22/14 219 lb (99.338 kg)    Current Medications:  Current Outpatient Prescriptions on File Prior to Visit  Medication Sig Dispense Refill  . alfuzosin (UROXATRAL) 10 MG 24 hr tablet Take 10 mg by mouth daily with breakfast.    . hydrocortisone (ANUSOL-HC) 25 MG suppository Insert 1 suppository rectally 3 to 4 x daily 24 suppository 3  . pravastatin (PRAVACHOL) 40 MG tablet TAKE 1 TABLET DAILY 90 tablet 1   No current facility-administered medications on file prior to visit.   Health Maintenance:  Immunization History  Administered Date(s) Administered  . Td  01/22/2007   Tetanus: 2008 Pneumovax: N/A Prevnar 13: N/A Flu vaccine: 2014 Zostavax: N/A DEXA: N/A Colonoscopy: 2013 Deatra Ina) - due 10 years EGD: N/A CT cervical: 2009 CT head 2009 DEE, Dr. Bing Plume- yearly  Patient Care Team: Unk Pinto, MD as PCP - General (Internal Medicine) Inda Castle, MD as Consulting Physician (Gastroenterology) Carolan Clines, MD as Consulting Physician (Urology) Marybelle Killings, MD as  Consulting Physician (Orthopedic Surgery) Crista Luria, MD as Consulting Physician (Dermatology)  Allergies:  Allergies  Allergen Reactions  . Sulfa Antibiotics     dizzy   Medical History:  Past Medical History  Diagnosis Date  . Colon polyp 04-28-2002    Colonoscopy   . Prostatitis   . Hyperlipemia   . Hypertension   . Insomnia   . HSV-2 infection    Surgical History:  Past Surgical History  Procedure Laterality Date  . Calcaneal osteotomy w/ internal fixation    . Colonoscopy  08/04/2012    Procedure: COLONOSCOPY;  Surgeon: Inda Castle, MD;  Location: [facility];  Service: Endoscopy;  Laterality: N/A;  with possible banding   Family History:  Family History  Problem Relation Age of Onset  . Colon cancer Maternal Grandmother    Social History:   History  Substance Use Topics  . Smoking status: Light Tobacco Smoker    Types: Cigars  . Smokeless tobacco: Never Used  . Alcohol Use: 1.2 oz/week    2 Glasses of wine per week   Review of Systems  Constitutional: Negative.   HENT: Negative.   Eyes: Negative.   Respiratory: Negative.   Cardiovascular: Negative.   Gastrointestinal: Positive for blood in stool. Negative for heartburn, nausea, vomiting, abdominal pain, diarrhea, constipation and melena.  Genitourinary: Negative.   Musculoskeletal: Positive for myalgias. Negative for back pain, joint pain, falls and neck pain.  Skin: Negative.   Neurological: Negative.   Endo/Heme/Allergies: Negative.   Psychiatric/Behavioral: Negative.      Physical Exam: Estimated body mass index is 29.28 kg/(m^2) as calculated from the following:   Height as of 02/09/15: 6' 0.5" (1.842 m).   Weight as of this encounter: 219 lb (99.338 kg). BP 138/82 mmHg  Pulse 60  Resp 16  Wt 219 lb (99.338 kg) General Appearance: Well nourished, in no apparent distress. Eyes: PERRLA, EOMs, conjunctiva no swelling or erythema, normal fundi and vessels. Sinuses: No Frontal/maxillary  tenderness ENT/Mouth: Ext aud canals clear, normal light reflex with TMs without erythema, bulging. Good dentition. No erythema, swelling, or exudate on post pharynx. Tonsils not swollen or erythematous. Hearing normal.  Neck: Supple, thyroid normal. No bruits Respiratory: Respiratory effort normal, BS equal bilaterally without rales, rhonchi, wheezing or stridor. Cardio: RRR without murmurs, rubs or gallops. Brisk peripheral pulses without edema.  Chest: symmetric, with normal excursions and percussion. Abdomen: Soft, +BS. Non tender, no guarding, rebound, hernias, masses, or organomegaly.  Lymphatics: Non tender without lymphadenopathy.  Genitourinary: defer Dr. Gaynelle Arabian Musculoskeletal: Full ROM all peripheral extremities,5/5 strength, and normal gait. Skin: Warm, dry without rashes, lesions, ecchymosis.  Neuro: Cranial nerves intact, reflexes equal bilaterally. Normal muscle tone, no cerebellar symptoms. Sensation intact.  Psych: Awake and oriented X 3, normal affect, Insight and Judgment appropriate.   EKG: defer Aorta Scan: defer until age 14  Vicie Mutters 9:38 AM

## 2015-03-17 NOTE — Patient Instructions (Addendum)
If the hemorrhoids continue please call Dr. Deatra Ina Phone: [phone]   Preventive Care for Adults  A healthy lifestyle and preventive care can promote health and wellness. Preventive health guidelines for men include the following key practices:  A routine yearly physical is a good way to check with your health care provider about your health and preventative screening. It is a chance to share any concerns and updates on your health and to receive a thorough exam.  Visit your dentist for a routine exam and preventative care every 6 months. Brush your teeth twice a day and floss once a day. Good oral hygiene prevents tooth decay and gum disease.  The frequency of eye exams is based on your age, health, family medical history, use of contact lenses, and other factors. Follow your health care provider's recommendations for frequency of eye exams.  Eat a healthy diet. Foods such as vegetables, fruits, whole grains, low-fat dairy products, and lean protein foods contain the nutrients you need without too many calories. Decrease your intake of foods high in solid fats, added sugars, and salt. Eat the right amount of calories for you.Get information about a proper diet from your health care provider, if necessary.  Regular physical exercise is one of the most important things you can do for your health. Most adults should get at least 150 minutes of moderate-intensity exercise (any activity that increases your heart rate and causes you to sweat) each week. In addition, most adults need muscle-strengthening exercises on 2 or more days a week.  Maintain a healthy weight. The body mass index (BMI) is a screening tool to identify possible weight problems. It provides an estimate of body fat based on height and weight. Your health care provider can find your BMI and can help you achieve or maintain a healthy weight.For adults 20 years and older:  A BMI below 18.5 is considered underweight.  A BMI of  18.5 to 24.9 is normal.  A BMI of 25 to 29.9 is considered overweight.  A BMI of 30 and above is considered obese.  Maintain normal blood lipids and cholesterol levels by exercising and minimizing your intake of saturated fat. Eat a balanced diet with plenty of fruit and vegetables. Blood tests for lipids and cholesterol should begin at age 65 and be repeated every 5 years. If your lipid or cholesterol levels are high, you are over 50, or you are at high risk for heart disease, you may need your cholesterol levels checked more frequently.Ongoing high lipid and cholesterol levels should be treated with medicines if diet and exercise are not working.  If you smoke, find out from your health care provider how to quit. If you do not use tobacco, do not start.  Lung cancer screening is recommended for adults aged 74-80 years who are at high risk for developing lung cancer because of a history of smoking. A yearly low-dose CT scan of the lungs is recommended for people who have at least a 30-pack-year history of smoking and are a current smoker or have quit within the past 15 years. A pack year of smoking is smoking an average of 1 pack of cigarettes a day for 1 year (for example: 1 pack a day for 30 years or 2 packs a day for 15 years). Yearly screening should continue until the smoker has stopped smoking for at least 15 years. Yearly screening should be stopped for people who develop a health problem that would prevent them from having lung cancer  treatment.  If you choose to drink alcohol, do not have more than 2 drinks per day. One drink is considered to be 12 ounces (355 mL) of beer, 5 ounces (148 mL) of wine, or 1.5 ounces (44 mL) of liquor.  Avoid use of street drugs. Do not share needles with anyone. Ask for help if you need support or instructions about stopping the use of drugs.  High blood pressure causes heart disease and increases the risk of stroke. Your blood pressure should be checked at  least every 1-2 years. Ongoing high blood pressure should be treated with medicines, if weight loss and exercise are not effective.  If you are 45-11 years old, ask your health care provider if you should take aspirin to prevent heart disease.  Diabetes screening involves taking a blood sample to check your fasting blood sugar level. This should be done once every 3 years, after age 29, if you are within normal weight and without risk factors for diabetes. Testing should be considered at a younger age or be carried out more frequently if you are overweight and have at least 1 risk factor for diabetes.  Colorectal cancer can be detected and often prevented. Most routine colorectal cancer screening begins at the age of 68 and continues through age 10. However, your health care provider may recommend screening at an earlier age if you have risk factors for colon cancer. On a yearly basis, your health care provider may provide home test kits to check for hidden blood in the stool. Use of a small camera at the end of a tube to directly examine the colon (sigmoidoscopy or colonoscopy) can detect the earliest forms of colorectal cancer. Talk to your health care provider about this at age 12, when routine screening begins. Direct exam of the colon should be repeated every 5-10 years through age 63, unless early forms of precancerous polyps or small growths are found.   Talk with your health care provider about prostate cancer screening.  Testicular cancer screening isrecommended for adult males. Screening includes self-exam, a health care provider exam, and other screening tests. Consult with your health care provider about any symptoms you have or any concerns you have about testicular cancer.  Use sunscreen. Apply sunscreen liberally and repeatedly throughout the day. You should seek shade when your shadow is shorter than you. Protect yourself by wearing long sleeves, pants, a wide-brimmed hat, and sunglasses  year round, whenever you are outdoors.  Once a month, do a whole-body skin exam, using a mirror to look at the skin on your back. Tell your health care provider about new moles, moles that have irregular borders, moles that are larger than a pencil eraser, or moles that have changed in shape or color.  Stay current with required vaccines (immunizations).  Influenza vaccine. All adults should be immunized every year.  Tetanus, diphtheria, and acellular pertussis (Td, Tdap) vaccine. An adult who has not previously received Tdap or who does not know his vaccine status should receive 1 dose of Tdap. This initial dose should be followed by tetanus and diphtheria toxoids (Td) booster doses every 10 years. Adults with an unknown or incomplete history of completing a 3-dose immunization series with Td-containing vaccines should begin or complete a primary immunization series including a Tdap dose. Adults should receive a Td booster every 10 years.  Varicella vaccine. An adult without evidence of immunity to varicella should receive 2 doses or a second dose if he has previously received 1 dose.  Human papillomavirus (HPV) vaccine. Males aged 20-21 years who have not received the vaccine previously should receive the 3-dose series. Males aged 22-26 years may be immunized. Immunization is recommended through the age of 55 years for any male who has sex with males and did not get any or all doses earlier. Immunization is recommended for any person with an immunocompromised condition through the age of 52 years if he did not get any or all doses earlier. During the 3-dose series, the second dose should be obtained 4-8 weeks after the first dose. The third dose should be obtained 24 weeks after the first dose and 16 weeks after the second dose.  Zoster vaccine. One dose is recommended for adults aged 25 years or older unless certain conditions are present.    PREVNAR  - Pneumococcal 13-valent conjugate (PCV13)  vaccine. When indicated, a person who is uncertain of his immunization history and has no record of immunization should receive the PCV13 vaccine. An adult aged 81 years or older who has certain medical conditions and has not been previously immunized should receive 1 dose of PCV13 vaccine. This PCV13 should be followed with a dose of pneumococcal polysaccharide (PPSV23) vaccine. The PPSV23 vaccine dose should be obtained at least 8 weeks after the dose of PCV13 vaccine. An adult aged 75 years or older who has certain medical conditions and previously received 1 or more doses of PPSV23 vaccine should receive 1 dose of PCV13. The PCV13 vaccine dose should be obtained 1 or more years after the last PPSV23 vaccine dose.    PNEUMOVAX - Pneumococcal polysaccharide (PPSV23) vaccine. When PCV13 is also indicated, PCV13 should be obtained first. All adults aged 51 years and older should be immunized. An adult younger than age 34 years who has certain medical conditions should be immunized. Any person who resides in a nursing home or long-term care facility should be immunized. An adult smoker should be immunized. People with an immunocompromised condition and certain other conditions should receive both PCV13 and PPSV23 vaccines. People with human immunodeficiency virus (HIV) infection should be immunized as soon as possible after diagnosis. Immunization during chemotherapy or radiation therapy should be avoided. Routine use of PPSV23 vaccine is not recommended for American Indians, [place] Natives, or people younger than 65 years unless there are medical conditions that require PPSV23 vaccine. When indicated, people who have unknown immunization and have no record of immunization should receive PPSV23 vaccine. One-time revaccination 5 years after the first dose of PPSV23 is recommended for people aged 19-64 years who have chronic kidney failure, nephrotic syndrome, asplenia, or immunocompromised conditions. People who  received 1-2 doses of PPSV23 before age 63 years should receive another dose of PPSV23 vaccine at age 24 years or later if at least 5 years have passed since the previous dose. Doses of PPSV23 are not needed for people immunized with PPSV23 at or after age 32 years.    Hepatitis A vaccine. Adults who wish to be protected from this disease, have certain high-risk conditions, work with hepatitis A-infected animals, work in hepatitis A research labs, or travel to or work in countries with a high rate of hepatitis A should be immunized. Adults who were previously unvaccinated and who anticipate close contact with an international adoptee during the first 60 days after arrival in the [place] from a country with a high rate of hepatitis A should be immunized.    Hepatitis B vaccine. Adults should be immunized if they wish to be protected  from this disease, have certain high-risk conditions, may be exposed to blood or other infectious body fluids, are household contacts or sex partners of hepatitis B positive people, are clients or workers in certain care facilities, or travel to or work in countries with a high rate of hepatitis B.   Preventive Service / Frequency   Ages 20 to 60  Blood pressure check.  Lipid and cholesterol check  Lung cancer screening. / Every year if you are aged 34-80 years and have a 30-pack-year history of smoking and currently smoke or have quit within the past 15 years. Yearly screening is stopped once you have quit smoking for at least 15 years or develop a health problem that would prevent you from having lung cancer treatment.  Fecal occult blood test (FOBT) of stool. / Every year beginning at age 44 and continuing until age 51. You may not have to do this test if you get a colonoscopy every 10 years.  Flexible sigmoidoscopy** or colonoscopy.** / Every 5 years for a flexible sigmoidoscopy or every 10 years for a colonoscopy beginning at age 67 and continuing until  age 61. Screening for abdominal aortic aneurysm (AAA)  by ultrasound is recommended for people who have history of high blood pressure or who are current or former smokers.

## 2015-03-18 LAB — BASIC METABOLIC PANEL WITH GFR
BUN: 11 mg/dL (ref 6–23)
CALCIUM: 9.4 mg/dL (ref 8.4–10.5)
CHLORIDE: 104 meq/L (ref 96–112)
CO2: 27 meq/L (ref 19–32)
CREATININE: 0.96 mg/dL (ref 0.50–1.35)
GFR, EST NON AFRICAN AMERICAN: 87 mL/min
GFR, Est African American: >89 mL/min
Glucose, Bld: 93 mg/dL (ref 70–99)
Potassium: 4.3 mEq/L (ref 3.5–5.3)
SODIUM: 142 meq/L (ref 135–145)

## 2015-03-18 LAB — HEPATIC FUNCTION PANEL
ALK PHOS: 74 U/L (ref 39–117)
ALT: 46 U/L (ref 0–53)
AST: 30 U/L (ref 0–37)
Albumin: 4.7 g/dL (ref 3.5–5.2)
BILIRUBIN DIRECT: 0.1 mg/dL (ref 0.0–0.3)
BILIRUBIN INDIRECT: 0.6 mg/dL (ref 0.2–1.2)
Total Bilirubin: 0.7 mg/dL (ref 0.2–1.2)
Total Protein: 7.2 g/dL (ref 6.0–8.3)

## 2015-03-18 LAB — URINALYSIS, ROUTINE W REFLEX MICROSCOPIC
Bilirubin Urine: NEGATIVE
Glucose, UA: NEGATIVE mg/dL
HGB URINE DIPSTICK: NEGATIVE
Ketones, ur: NEGATIVE mg/dL
LEUKOCYTES UA: NEGATIVE
Nitrite: NEGATIVE
PROTEIN: NEGATIVE mg/dL
Specific Gravity, Urine: <1.005 — ABNORMAL LOW (ref 1.005–1.030)
UROBILINOGEN UA: 0.2 mg/dL (ref 0.0–1.0)
pH: 7.5 (ref 5.0–8.0)

## 2015-03-18 LAB — HIV ANTIBODY (ROUTINE TESTING W REFLEX): HIV 1&2 Ab, 4th Generation: NONREACTIVE

## 2015-03-18 LAB — MICROALBUMIN / CREATININE URINE RATIO
CREATININE, URINE: 82.5 mg/dL
Microalb, Ur: <0.2 mg/dL (ref <2.0)

## 2015-03-18 LAB — INSULIN, FASTING: Insulin fasting, serum: 6 u[IU]/mL (ref 2.0–19.6)

## 2015-03-18 LAB — LIPID PANEL
CHOLESTEROL: 192 mg/dL (ref 0–200)
HDL: 38 mg/dL — ABNORMAL LOW (ref >=40)
LDL Cholesterol: 112 mg/dL — ABNORMAL HIGH (ref 0–99)
Total CHOL/HDL Ratio: 5.1 Ratio
Triglycerides: 211 mg/dL — ABNORMAL HIGH (ref <150)
VLDL: 42 mg/dL — ABNORMAL HIGH (ref 0–40)

## 2015-03-18 LAB — VITAMIN D 25 HYDROXY (VIT D DEFICIENCY, FRACTURES): Vit D, 25-Hydroxy: 33 ng/mL (ref 30–100)

## 2015-03-18 LAB — RPR

## 2015-03-18 LAB — HEPATITIS C ANTIBODY: HCV AB: NEGATIVE

## 2015-03-18 LAB — MAGNESIUM: Magnesium: 1.9 mg/dL (ref 1.5–2.5)

## 2015-03-18 LAB — TSH: TSH: 1.08 u[IU]/mL (ref 0.350–4.500)

## 2015-03-27 ENCOUNTER — Telehealth: Payer: Self-pay | Admitting: Gastroenterology

## 2015-03-27 NOTE — Telephone Encounter (Signed)
The patient is being treated presently with Anusol suppositories for internal hemorrhoids. He has also been referred back to [place] by Vicie Mutters for the same thing. He says the suppositories are not helping. He has discomfort mostly at night. No pain with BM, but bleeding with each bowel movement. The first OV is July. Same situation for procedure. Is it any good to bring him in with an extender?

## 2015-03-28 NOTE — Telephone Encounter (Signed)
I think it is worthwhile bringing him in.  Please inform extendor that, if he is symptomatic from internal hemorrhoids, he ought to be scheduled for band ligation.

## 2015-03-28 NOTE — Telephone Encounter (Signed)
Emailed the patient. He is scheduled with Dr Deatra Ina on 04/26/15 which he is not certain he can keep. Offered appointment with Physician Assistant.

## 2015-04-17 ENCOUNTER — Encounter: Payer: Self-pay | Admitting: Physician Assistant

## 2015-04-17 ENCOUNTER — Ambulatory Visit (INDEPENDENT_AMBULATORY_CARE_PROVIDER_SITE_OTHER): Payer: BLUE CROSS/BLUE SHIELD | Admitting: Physician Assistant

## 2015-04-17 VITALS — BP 138/78 | HR 70 | Ht 72.0 in | Wt 214.8 lb

## 2015-04-17 DIAGNOSIS — K648 Other hemorrhoids: Secondary | ICD-10-CM | POA: Diagnosis not present

## 2015-04-17 DIAGNOSIS — K644 Residual hemorrhoidal skin tags: Secondary | ICD-10-CM

## 2015-04-17 NOTE — Progress Notes (Signed)
Patient ID: QUILL GRINDER, male   DOB: 12/18/1955, 59 y.o.   MRN: [id number]     History of Present Illness: Jeremy Fowler is a pleasant 59 year old male known to Dr. Deatra Ina from prior colonoscopies. He has a family history of colon polyps. On 04/01/2002 he had a colonoscopy with removal of rectal polyp. Surveillance colonoscopy on 05/13/2007 was a normal exam. His subsequent colonoscopy was on 08/04/2012 at which time 3 cecal polyps were removed and found to be adenomatous he was also noted to have small internal hemorrhoids. He was advised to have surveillance in 5 years. He reports over the past year he has been having trouble with his hemorrhoids more frequently. He was evaluated at his primary care provider's office in March for external hemorrhoids. He was prescribed Anusol cream which provided some relief. In May he began to have some rectal pain and pressure with intermittent bleeding of blood dripping in the commode and on the toilet tissue. He was seen by his primary care provider and prescribed Anusol HC suppositories which provided varying degrees of relief since May. He states his hemorrhoids have come and gone. He moves his bowels on a daily basis for the most part but will occasionally skip a day. He is employed as a Insurance underwriter for [employer] and sometimes has an erratic schedule. As of today. He states he is not having a lot of rectal pain but he does have a sensation of incomplete evacuation. He denies anal seepage. He has no abdominal pain.   Past Medical History  Diagnosis Date  . Colon polyp 04-28-2002    Colonoscopy   . Prostatitis   . Hyperlipemia   . Hypertension   . Insomnia   . HSV-2 infection     Past Surgical History  Procedure Laterality Date  . Calcaneal osteotomy w/ internal fixation    . Colonoscopy  08/04/2012    Procedure: COLONOSCOPY;  Surgeon: Inda Castle, MD;  Location: [facility];  Service: Endoscopy;  Laterality: N/A;  with possible banding   Family  History  Problem Relation Age of Onset  . Colon cancer Maternal Grandmother     dx in her late 32's   History  Substance Use Topics  . Smoking status: Light Tobacco Smoker    Types: Cigars  . Smokeless tobacco: Never Used     Comment: Tobacco info given 04/17/15  . Alcohol Use: 1.2 oz/week    2 Glasses of wine per week   Current Outpatient Prescriptions  Medication Sig Dispense Refill  . acyclovir (ZOVIRAX) 400 MG tablet Take 1 tablet (400 mg total) by mouth 2 (two) times daily. (Patient taking differently: Take 400 mg by mouth 2 (two) times daily as needed. ) 180 tablet 1  . alfuzosin (UROXATRAL) 10 MG 24 hr tablet Take 1 tablet (10 mg total) by mouth daily with breakfast. 90 tablet 3  . clotrimazole-betamethasone (LOTRISONE) cream Apply 1 application topically 2 (two) times daily. 15 g 2  . Eszopiclone 3 MG TABS TAKE ONE TABLET BY MOUTH AT BEDTIME AS NEEDED-NEEDS TO BE SEEN (Patient taking differently: 3 mg 3 times/day as needed-between meals & bedtime. TAKE ONE TABLET BY MOUTH AT BEDTIME AS NEEDED-NEEDS TO BE SEEN) 30 tablet 3  . hydrocortisone (ANUSOL-HC) 25 MG suppository Insert 1 suppository rectally 3 to 4 x daily 12 suppository 3  . nystatin (MYCOSTATIN/NYSTOP) 100000 UNIT/GM POWD Apply daily after a shower as needed 60 g 2  . pravastatin (PRAVACHOL) 40 MG tablet TAKE 1  TABLET DAILY 90 tablet 1   No current facility-administered medications for this visit.   Allergies  Allergen Reactions  . Sulfa Antibiotics     dizzy      Review of Systems: Gen: Denies any fever, chills, sweats, anorexia, fatigue, weakness, malaise, weight loss, and sleep disorder CV: Denies chest pain, angina, palpitations, syncope, orthopnea, PND, peripheral edema, and claudication. Resp: Denies dyspnea at rest, dyspnea with exercise, cough, sputum, wheezing, coughing up blood, and pleurisy. GI: Denies vomiting blood, jaundice, and fecal incontinence.   Denies dysphagia or odynophagia. GU : Denies  urinary burning, blood in urine, urinary frequency, urinary hesitancy, nocturnal urination, and urinary incontinence. MS: Denies joint pain, limitation of movement, and swelling, stiffness, low back pain, extremity pain. Denies muscle weakness, cramps, atrophy.  Derm: Denies rash, itching, dry skin, hives, moles, warts, or unhealing ulcers.  Psych: Denies depression, anxiety, memory loss, suicidal ideation, hallucinations, paranoia, and confusion. Heme: Denies bruising, bleeding, and enlarged lymph nodes. Neuro:  Denies any headaches, dizziness, paresthesia Endo:  Denies any problems with DM, thyroid, adrenal    Physical Exam: General: Pleasant, well developed male in no acute distress Head: Normocephalic and atraumatic Eyes:  sclerae anicteric, conjunctiva pink  Ears: Normal auditory acuity Lungs: Clear throughout to auscultation Heart: Regular rate and rhythm Abdomen: Soft, non distended, non-tender. No masses, no hepatomegaly. Normal bowel sounds Rectal: Small external hemorrhoid noted. Brown stool, heme negative. Anoscopy reveals small posterior and lateral internal hemorrhoids. Musculoskeletal: Symmetrical with no gross deformities  Extremities: No edema  Neurological: Alert oriented x 4, grossly nonfocal Psychological:  Alert and cooperative. Normal mood and affect  Assessment and Recommendations: 59 year old male with a history of internal hemorrhoids that have been more problematic for him over the past year referred for evaluation. He should states he is interested in pursuing banding as he has done research on this. At this point he's been instructed to use a stool softener as needed. He will use Tucks wipes as needed. He will use his Anusol suppositories as needed. He will be scheduled for hemorrhoidal banding with Dr. Deatra Ina.        Rocio Wolak, Deloris Ping 04/17/2015,

## 2015-04-17 NOTE — Patient Instructions (Signed)
Continue Anusol Cream as needed.  Please call our office to schedule an appointment with Dr. Deatra Ina for Hemorrhoid Banding at [phone] option 2.

## 2015-04-18 NOTE — Progress Notes (Signed)
Reviewed and agree with management. Nyja Westbrook D. Leliana Kontz, M.D., FACG  

## 2015-04-24 ENCOUNTER — Telehealth: Payer: Self-pay | Admitting: Gastroenterology

## 2015-04-24 NOTE — Telephone Encounter (Signed)
Patient scheduled for hemorrhoid banding

## 2015-04-28 ENCOUNTER — Ambulatory Visit: Payer: Self-pay | Admitting: Gastroenterology

## 2015-05-11 ENCOUNTER — Ambulatory Visit (INDEPENDENT_AMBULATORY_CARE_PROVIDER_SITE_OTHER): Payer: BLUE CROSS/BLUE SHIELD | Admitting: Gastroenterology

## 2015-05-11 ENCOUNTER — Encounter: Payer: Self-pay | Admitting: Gastroenterology

## 2015-05-11 VITALS — BP 122/76 | HR 68 | Ht 71.0 in | Wt 216.0 lb

## 2015-05-11 DIAGNOSIS — K648 Other hemorrhoids: Secondary | ICD-10-CM

## 2015-05-11 NOTE — Patient Instructions (Addendum)
You have been given a separate informational sheet regarding your tobacco use, the importance of quitting and local resources to help you quit. Pt declined as he got a form a few weeks ago.  HEMORRHOID BANDING PROCEDURE    FOLLOW-UP CARE   1. The procedure you have had should have been relatively painless since the banding of the area involved does not have nerve endings and there is no pain sensation.  The rubber band cuts off the blood supply to the hemorrhoid and the band may fall off as soon as 48 hours after the banding (the band may occasionally be seen in the toilet bowl following a bowel movement). You may notice a temporary feeling of fullness in the rectum which should respond adequately to plain Tylenol or Motrin.  2. Following the banding, avoid strenuous exercise that evening and resume full activity the next day.  A sitz bath (soaking in a warm tub) or bidet is soothing, and can be useful for cleansing the area after bowel movements.     3. To avoid constipation, take two tablespoons of natural wheat bran, natural oat bran, flax, Benefiber or any over the counter fiber supplement and increase your water intake to 7-8 glasses daily.    4. Unless you have been prescribed anorectal medication, do not put anything inside your rectum for two weeks: No suppositories, enemas, fingers, etc.  5. Occasionally, you may have more bleeding than usual after the banding procedure.  This is often from the untreated hemorrhoids rather than the treated one.  Don't be concerned if there is a tablespoon or so of blood.  If there is more blood than this, lie flat with your bottom higher than your head and apply an ice pack to the area. If the bleeding does not stop within a half an hour or if you feel faint, call our office at [phone] or go to the emergency room.  6. Problems are not common; however, if there is a substantial amount of bleeding, severe pain, chills, fever or difficulty passing  urine (very rare) or other problems, you should call us at [phone] or report to the nearest emergency room.  7. Do not stay seated continuously for more than 2-3 hours for a day or two after the procedure.  Tighten your buttock muscles 10-15 times every two hours and take 10-15 deep breaths every 1-2 hours.  Do not spend more than a few minutes on the toilet if you cannot empty your bowel; instead re-visit the toilet at a later time.   Your 2nd banding is scheduled on 07/14/2015 at 2:45pm

## 2015-05-11 NOTE — Assessment & Plan Note (Signed)
05/11/15 band ligation right posterior hemorrhoidal bundle

## 2015-05-11 NOTE — Progress Notes (Signed)
PROCEDURE NOTE: The patient presents with symptomatic grade *2**  hemorrhoids, requesting rubber band ligation of his/her hemorrhoidal disease.  All risks, benefits and alternative forms of therapy were described and informed consent was obtained.   The anorectum was pre-medicated with lubricant and nitroglycerine ointment The decision was made to band the *right posterior** internal hemorrhoid, and the [employer] was used to perform band ligation without complication.  Digital anorectal examination was then performed to assure proper positioning of the band, and to adjust the banded tissue as required.  The patient was discharged home without pain or other issues.  Dietary and behavioral recommendations were given and along with follow-up instructions.    The patient will return in **4* weeks for  follow-up and possible additional banding as required. No complications were encountered and the patient tolerated the procedure well.

## 2015-06-14 ENCOUNTER — Ambulatory Visit (INDEPENDENT_AMBULATORY_CARE_PROVIDER_SITE_OTHER): Payer: BLUE CROSS/BLUE SHIELD | Admitting: Gastroenterology

## 2015-06-14 ENCOUNTER — Encounter: Payer: Self-pay | Admitting: Gastroenterology

## 2015-06-14 VITALS — BP 114/70 | HR 64 | Ht 71.0 in | Wt 216.5 lb

## 2015-06-14 DIAGNOSIS — K648 Other hemorrhoids: Secondary | ICD-10-CM

## 2015-06-14 NOTE — Progress Notes (Signed)
PROCEDURE NOTE: The patient presents with symptomatic grade *2**  hemorrhoids, requesting rubber band ligation of his/her hemorrhoidal disease.  All risks, benefits and alternative forms of therapy were described and informed consent was obtained.   The anorectum was pre-medicated with lubricant and nitroglycerine ointment The decision was made to band the right*anterior** internal hemorrhoid, and the [facility] System was used to perform band ligation without complication.  Digital anorectal examination was then performed to assure proper positioning of the band, and to adjust the banded tissue as required.  The patient was discharged home without pain or other issues.  Dietary and behavioral recommendations were given and along with follow-up instructions.    The patient will return in *4** weeks for  follow-up and possible additional banding as required. No complications were encountered and the patient tolerated the procedure well.   

## 2015-06-14 NOTE — Patient Instructions (Signed)
HEMORRHOID BANDING PROCEDURE    FOLLOW-UP CARE   1. The procedure you have had should have been relatively painless since the banding of the area involved does not have nerve endings and there is no pain sensation.  The rubber band cuts off the blood supply to the hemorrhoid and the band may fall off as soon as 48 hours after the banding (the band may occasionally be seen in the toilet bowl following a bowel movement). You may notice a temporary feeling of fullness in the rectum which should respond adequately to plain Tylenol or Motrin.  2. Following the banding, avoid strenuous exercise that evening and resume full activity the next day.  A sitz bath (soaking in a warm tub) or bidet is soothing, and can be useful for cleansing the area after bowel movements.     3. To avoid constipation, take two tablespoons of natural wheat bran, natural oat bran, flax, Benefiber or any over the counter fiber supplement and increase your water intake to 7-8 glasses daily.    4. Unless you have been prescribed anorectal medication, do not put anything inside your rectum for two weeks: No suppositories, enemas, fingers, etc.  5. Occasionally, you may have more bleeding than usual after the banding procedure.  This is often from the untreated hemorrhoids rather than the treated one.  Don't be concerned if there is a tablespoon or so of blood.  If there is more blood than this, lie flat with your bottom higher than your head and apply an ice pack to the area. If the bleeding does not stop within a half an hour or if you feel faint, call our office at [phone] or go to the emergency room.  6. Problems are not common; however, if there is a substantial amount of bleeding, severe pain, chills, fever or difficulty passing urine (very rare) or other problems, you should call us at [phone] or report to the nearest emergency room.  7. Do not stay seated continuously for more than 2-3 hours for a day or two  after the procedure.  Tighten your buttock muscles 10-15 times every two hours and take 10-15 deep breaths every 1-2 hours.  Do not spend more than a few minutes on the toilet if you cannot empty your bowel; instead re-visit the toilet at a later time.   Your 3rd banding is scheduled on 08/02/2015 at 8:30am

## 2015-07-06 ENCOUNTER — Telehealth: Payer: Self-pay | Admitting: Internal Medicine

## 2015-07-06 NOTE — Telephone Encounter (Signed)
I closed this encounter in error. But see below. We hope to be able to work something out that is convenient for him.

## 2015-07-06 NOTE — Telephone Encounter (Signed)
The doctors that do the banding are trying to accommodate this patient. The appointment will not be on thoses days. As soon as the create new openings, he could be contacted to see what fits into his schedule. There should be options.

## 2015-07-13 ENCOUNTER — Ambulatory Visit (INDEPENDENT_AMBULATORY_CARE_PROVIDER_SITE_OTHER): Payer: BLUE CROSS/BLUE SHIELD | Admitting: Internal Medicine

## 2015-07-13 ENCOUNTER — Encounter: Payer: Self-pay | Admitting: Internal Medicine

## 2015-07-13 VITALS — BP 114/76 | HR 60 | Ht 71.0 in | Wt 214.1 lb

## 2015-07-13 DIAGNOSIS — K648 Other hemorrhoids: Secondary | ICD-10-CM

## 2015-07-13 DIAGNOSIS — K641 Second degree hemorrhoids: Secondary | ICD-10-CM

## 2015-07-13 NOTE — Progress Notes (Signed)
   PROCEDURE NOTE: The patient presents with symptomatic grade 2  hemorrhoids, requesting rubber band ligation of his/her hemorrhoidal disease.  All risks, benefits and alternative forms of therapy were described and informed consent was obtained.  Better after banding RA/RP  The anorectum was pre-medicated with NTG/lidocaine The decision was made to band the LL internal hemorrhoid, and the [place] was used to perform band ligation without complication.  Digital anorectal examination was then performed to assure proper positioning of the band, and to adjust the banded tissue as required.  The patient was discharged home without pain or other issues.  Dietary and behavioral recommendations were given and along with follow-up instructions.       The patient will return prn for  follow-up and possible additional banding as required. No complications were encountered and the patient tolerated the procedure well.

## 2015-07-13 NOTE — Patient Instructions (Signed)
  HEMORRHOID BANDING PROCEDURE    FOLLOW-UP CARE   1. The procedure you have had should have been relatively painless since the banding of the area involved does not have nerve endings and there is no pain sensation.  The rubber band cuts off the blood supply to the hemorrhoid and the band may fall off as soon as 48 hours after the banding (the band may occasionally be seen in the toilet bowl following a bowel movement). You may notice a temporary feeling of fullness in the rectum which should respond adequately to plain Tylenol or Motrin.  2. Following the banding, avoid strenuous exercise that evening and resume full activity the next day.  A sitz bath (soaking in a warm tub) or bidet is soothing, and can be useful for cleansing the area after bowel movements.     3. To avoid constipation, take two tablespoons of natural wheat bran, natural oat bran, flax, Benefiber or any over the counter fiber supplement and increase your water intake to 7-8 glasses daily.    4. Unless you have been prescribed anorectal medication, do not put anything inside your rectum for two weeks: No suppositories, enemas, fingers, etc.  5. Occasionally, you may have more bleeding than usual after the banding procedure.  This is often from the untreated hemorrhoids rather than the treated one.  Don't be concerned if there is a tablespoon or so of blood.  If there is more blood than this, lie flat with your bottom higher than your head and apply an ice pack to the area. If the bleeding does not stop within a half an hour or if you feel faint, call our office at [phone] or go to the emergency room.  6. Problems are not common; however, if there is a substantial amount of bleeding, severe pain, chills, fever or difficulty passing urine (very rare) or other problems, you should call us at [phone] or report to the nearest emergency room.  7. Do not stay seated continuously for more than 2-3 hours for a day or  two after the procedure.  Tighten your buttock muscles 10-15 times every two hours and take 10-15 deep breaths every 1-2 hours.  Do not spend more than a few minutes on the toilet if you cannot empty your bowel; instead re-visit the toilet at a later time.    Follow up with as needed.    I appreciate the opportunity to care for you. Silvano Rusk, MD, [facility]

## 2015-07-14 ENCOUNTER — Encounter: Payer: BLUE CROSS/BLUE SHIELD | Admitting: Gastroenterology

## 2015-07-15 ENCOUNTER — Encounter: Payer: Self-pay | Admitting: Internal Medicine

## 2015-07-15 NOTE — Assessment & Plan Note (Signed)
LL banded F/u prn

## 2015-07-17 ENCOUNTER — Telehealth: Payer: Self-pay | Admitting: Internal Medicine

## 2015-07-17 NOTE — Telephone Encounter (Signed)
Patient reports continued itching and pain after hemorrhoid banding last Thursday.  He is advised that he may not see the full effects of the banding for 7-10 days following the banding. He is advised that he can try Recticare and apply to rectum TID as needed.  He will call back for additional questions or continued symptoms.

## 2015-07-18 MED ORDER — TRAMADOL HCL 50 MG PO TABS: ORAL_TABLET | ORAL | Status: DC

## 2015-07-18 NOTE — Telephone Encounter (Signed)
Patient reports rectal pain from hemorrhoid banding on Thursday with Dr. Carlean Purl.  He is in [place] and heading to [place], [place].  He states the pain is a 2 or 3 on a scale of 1-10.  Worse at night, "throbs".  He was advised to try Recticare, he states that this does not help at all.  He wants to be seen when he returns on Thursday. He is asking for pain medication, he is advised that we can't send narcotics to another state.    Dr. Fuller Plan you are MD of the day.  Please advise.

## 2015-07-18 NOTE — Telephone Encounter (Signed)
Anxious to get a call back.

## 2015-07-18 NOTE — Telephone Encounter (Signed)
Dr. Carlean Purl is out of the office all week at a conference and Dr. Hilarie Fredrickson is off on Thursday.  The APPs are full

## 2015-07-18 NOTE — Telephone Encounter (Signed)
Probably too late to manipulate hemorrhoidal band. If Tylenol, Advil and Sitz baths aren't adequately controlling his symptoms please give him Tramadol 50 mg po tid, #20, no refills.

## 2015-07-18 NOTE — Telephone Encounter (Signed)
He needs to be seen on Thursday by [facility] or [facility]-both perform hemorrhoid banding. The band may need to be removed or repositioned. He can take Advil 600 mg tid or Tylenol 2 po qid for pain and use Sitz baths since he is out of the state now.

## 2015-07-18 NOTE — Telephone Encounter (Signed)
Patient notified rx called to [facility] in [place], [place].  I spoke with Lea.  Patient will call with an update next week

## 2015-07-18 NOTE — Telephone Encounter (Signed)
Patient left message with answering service stating that he is still having problems with itching and pain and is requesting an appointment on Thursday. Patient is requesting a call back after 12:15pm. [phone]

## 2015-07-20 MED ORDER — PRAMOXINE-HC 1-1 % EX CREA: TOPICAL_CREAM | Freq: Three times a day (TID) | CUTANEOUS | Status: DC

## 2015-07-20 NOTE — Telephone Encounter (Signed)
Patient called with intense rectal itching after hemorrhoid banding.  He reports that the recticare is not helping at all.  Can I send in some analpram?  He had hemorrhoid banding last Thursday with Dr. Carlean Purl .  Dr. Silverio Decamp you are [facility] of the day.  Please advise

## 2015-07-20 NOTE — Telephone Encounter (Signed)
Ok to send Rx  For analpram. Thanks

## 2015-07-20 NOTE — Addendum Note (Signed)
Addended by: Marlon Pel on: 07/20/2015 02:00 PM   Modules accepted: Orders

## 2015-07-20 NOTE — Telephone Encounter (Signed)
Patient notified

## 2015-07-24 ENCOUNTER — Encounter: Payer: Self-pay | Admitting: Gastroenterology

## 2015-07-24 ENCOUNTER — Ambulatory Visit (INDEPENDENT_AMBULATORY_CARE_PROVIDER_SITE_OTHER): Payer: BLUE CROSS/BLUE SHIELD | Admitting: Gastroenterology

## 2015-07-24 VITALS — BP 132/80 | HR 84 | Ht 71.0 in | Wt 215.4 lb

## 2015-07-24 DIAGNOSIS — L309 Dermatitis, unspecified: Secondary | ICD-10-CM

## 2015-07-24 MED ORDER — NYSTATIN-TRIAMCINOLONE 100000-0.1 UNIT/GM-% EX OINT: 1.0000 "application " | TOPICAL_OINTMENT | Freq: Two times a day (BID) | CUTANEOUS | Status: DC

## 2015-07-24 MED ORDER — FLUCONAZOLE 100 MG PO TABS: ORAL_TABLET | ORAL | Status: DC

## 2015-07-24 NOTE — Patient Instructions (Signed)
We have sent the following medications to your pharmacy for you to pick up at your convenience: Diflucan and Nystatin-Triamcinolone

## 2015-07-24 NOTE — Progress Notes (Signed)
HPI: This is a  very pleasant 59 year old man whom I am meeting for the first time today  He had his third internal hemorrhoid band placed about 10 days ago by Dr. Carlean Purl. Started very shortly after that he began to have anal discomfort and itching. The itching has progressively gotten worse. He has tried a variety of ointments including Analpram. He was called in tramadol. He went to the urgent clinic yesterday and they gave him what sounds like a topical yeast medicine. That did not help. His symptoms have begun to be excruciatingly itching and burning.  Chief complaint is anal burning, itching  He has never had contact allergies, no latex allergies. He tolerated the banding twice before without issue.   Past Medical History  Diagnosis Date  . Colon polyp 04-28-2002    Colonoscopy   . Prostatitis   . Hyperlipemia   . Hypertension   . Insomnia   . HSV-2 infection   . Prolapsed internal hemorrhoids, grade 2 08/04/2012    Grade 2 hemorrhoids  05/11/15 band ligation right posterior hemorrhoidal bundle 06/14/15 band ligation right anterior hemorrhoidal bundle 07/13/15 LL banded     Past Surgical History  Procedure Laterality Date  . Calcaneal osteotomy w/ internal fixation    . Colonoscopy  08/04/2012    Procedure: COLONOSCOPY;  Surgeon: Inda Castle, MD;  Location: [facility] ENDOSCOPY;  Service: Endoscopy;  Laterality: N/A;  with possible banding  . Hemorrhoid banding    . Hemorrhoid banding  2016    Current Outpatient Prescriptions  Medication Sig Dispense Refill  . cephALEXin (KEFLEX) 500 MG capsule Take 500 mg by mouth.    . miconazole (MICATIN) 2 % cream Apply to affected area 2 times daily    . acyclovir (ZOVIRAX) 400 MG tablet Take 1 tablet (400 mg total) by mouth 2 (two) times daily. (Patient taking differently: Take 400 mg by mouth 2 (two) times daily as needed. ) 180 tablet 1  . alfuzosin (UROXATRAL) 10 MG 24 hr tablet Take 1 tablet (10 mg total) by mouth daily with breakfast. 90  tablet 3  . clotrimazole-betamethasone (LOTRISONE) cream Apply 1 application topically 2 (two) times daily. 15 g 2  . Eszopiclone 3 MG TABS TAKE ONE TABLET BY MOUTH AT BEDTIME AS NEEDED-NEEDS TO BE SEEN (Patient taking differently: 3 mg 3 times/day as needed-between meals & bedtime. TAKE ONE TABLET BY MOUTH AT BEDTIME AS NEEDED-NEEDS TO BE SEEN) 30 tablet 3  . hydrocortisone (ANUSOL-HC) 25 MG suppository Insert 1 suppository rectally 3 to 4 x daily 12 suppository 3  . nystatin (MYCOSTATIN/NYSTOP) 100000 UNIT/GM POWD Apply daily after a shower as needed 60 g 2  . pramoxine-hydrocortisone (ANALPRAM HC) cream Apply topically 3 (three) times daily. 30 g 1  . pravastatin (PRAVACHOL) 40 MG tablet TAKE 1 TABLET DAILY 90 tablet 1  . traMADol (ULTRAM) 50 MG tablet Take 1 po tid as needed for pain 20 tablet 0   No current facility-administered medications for this visit.    Allergies as of 07/24/2015 - Review Complete 07/24/2015  Allergen Reaction Noted  . Sulfa antibiotics  08/04/2012    Family History  Problem Relation Age of Onset  . Colon cancer Maternal Grandmother     dx in her late 82's    Social History   Social History  . Marital Status: Married    Spouse Name: N/A  . Number of Children: 2  . Years of Education: N/A   Occupational History  . Pilot  Social History Main Topics  . Smoking status: Light Tobacco Smoker    Types: Cigars  . Smokeless tobacco: Never Used     Comment: Tobacco info given 04/17/15  . Alcohol Use: 1.2 oz/week    2 Glasses of wine per week  . Drug Use: No  . Sexual Activity: Not on file   Other Topics Concern  . Not on file   Social History Narrative     Physical Exam: There were no vitals taken for this visit. Constitutional: generally well-appearing Psychiatric: alert and oriented x3 Abdomen: soft, nontender, nondistended, no obvious ascites, no peritoneal signs, normal bowel sounds Rectal examination. He has severe erythema e with  induration dema centered at his anus running down his thighs bilaterally involving scrotum.  Internal examination by Dr. Corky Sox was normalle   Assessment and plan: 59 y.o. male with perianal dermatitis, likely fungal  I asked Dr. Ann Maki to evaluate him since he does hemorrhoidal banding. He was happy to assist. Dr. Carlean Purl also did an examination here. .   This is a pretty severe fungal infection of the perianal skin. I'm going to prescribe Diflucan orally as well as nystatin triamcinolone topical to be done twice daily. He will call in the next few days if he is not improving.  Owens Loffler, MD [facility] 07/24/2015, 8:35 AM

## 2015-07-25 ENCOUNTER — Telehealth: Payer: Self-pay

## 2015-07-25 NOTE — Telephone Encounter (Signed)
2.5% OK to use with the other Tx

## 2015-07-25 NOTE — Telephone Encounter (Signed)
Received refill request for Analpram HC, they need to know what strength for the [facility] either 1% or 2.5%.  Do you want him to refill and use along with rx's he got yesterday from Dr Ardis Hughs. .... Oral diflucan and Nystatin.  Thank you.

## 2015-07-25 NOTE — Telephone Encounter (Signed)
[facility] informed of analpram HC strength, faxed info to them.

## 2015-08-02 ENCOUNTER — Encounter: Payer: BLUE CROSS/BLUE SHIELD | Admitting: Gastroenterology

## 2015-08-03 ENCOUNTER — Other Ambulatory Visit: Payer: Self-pay | Admitting: Gastroenterology

## 2015-08-03 ENCOUNTER — Telehealth: Payer: Self-pay | Admitting: Internal Medicine

## 2015-08-03 ENCOUNTER — Encounter: Payer: Self-pay | Admitting: Internal Medicine

## 2015-08-03 MED ORDER — FLUCONAZOLE 100 MG PO TABS: ORAL_TABLET | ORAL | Status: DC

## 2015-08-03 MED ORDER — NYSTATIN-TRIAMCINOLONE 100000-0.1 UNIT/GM-% EX OINT: 1.0000 "application " | TOPICAL_OINTMENT | Freq: Two times a day (BID) | CUTANEOUS | Status: DC

## 2015-08-03 NOTE — Telephone Encounter (Signed)
Rash coming back Sees dermatology again in a few days He is flying -   Asking for refill of nystatin and Diflucan which i will do.

## 2015-08-05 ENCOUNTER — Ambulatory Visit (INDEPENDENT_AMBULATORY_CARE_PROVIDER_SITE_OTHER): Payer: BLUE CROSS/BLUE SHIELD | Admitting: Family Medicine

## 2015-08-05 VITALS — BP 146/84 | HR 67 | Temp 97.9°F | Resp 18 | Ht 71.0 in | Wt 212.0 lb

## 2015-08-05 DIAGNOSIS — B356 Tinea cruris: Secondary | ICD-10-CM | POA: Diagnosis not present

## 2015-08-05 LAB — POCT SKIN KOH: Skin KOH, POC: NEGATIVE

## 2015-08-05 MED ORDER — CLOTRIMAZOLE-BETAMETHASONE 1-0.05 % EX CREA: 1.0000 "application " | TOPICAL_CREAM | Freq: Two times a day (BID) | CUTANEOUS | Status: DC

## 2015-08-05 MED ORDER — TERBINAFINE HCL 250 MG PO TABS: 250.0000 mg | ORAL_TABLET | Freq: Every day | ORAL | Status: DC

## 2015-08-05 NOTE — Patient Instructions (Signed)
Use the Lotrisone cream twice daily on areas of rash  Take the terbinafine 250 mg one pill daily for 2 weeks  That me know if you are not doing some better by Monday.

## 2015-08-05 NOTE — Progress Notes (Signed)
Patient ID: Jeremy Fowler, male    DOB: 11-13-1955  Age: 59 y.o. MRN: [id number]  Chief Complaint  Patient presents with  . Rash    x 1 month, groin, spreading to thigh and buttocks    Subjective:   Patient had a hemorrhoidal banding procedure several weeks ago. After that he developed a bad rash around his perianal area. He was treated for yeast with oral Diflucan and some creams. He said it calmed down a little bit but then his gotten worse. He has a rash around to the front now also. On the scrotum and medial thigh areas. He says it is itching him terribly.  He says he has tried "everything can". 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 He flies out again on Tuesday until Friday this week.  Current allergies, medications, problem list, past/family and social histories reviewed.  Objective:  BP 146/84 mmHg  Pulse 67  Temp(Src) 97.9 F (36.6 C)  Resp 18  Ht 5\' 11"  (1.803 m)  Wt 212 lb (96.163 kg)  BMI 29.58 kg/m2  SpO2 98%  Erythematous rash bright red on the scrotum and medial thigh areas. Less erythema but still a right wrist area for about 8 cm around the perianal region. It looks like it is semi-resolved in that region.  Assessment & Plan:   Assessment: 1. Dermatophytosis, groin       Plan: Skin scraping was done.  KOH was negative. No orders of the defined types were placed in this encounter.    Meds ordered this encounter  Medications  . clotrimazole-betamethasone (LOTRISONE) cream    Sig: Apply 1 application topically 2 (two) times daily.    Dispense:  30 g    Refill:  0  . terbinafine (LAMISIL) 250 MG tablet    Sig: Take 1 tablet (250 mg total) by mouth daily.    Dispense:  14 tablet    Refill:  0         Patient Instructions  Use the Lotrisone cream twice daily on areas of rash  Take the terbinafine 250 mg one pill daily for 2 weeks  That me know if you are not doing some better by Monday.    Return if symptoms worsen or fail to  improve.   HOPPER,DAVID, MD 08/05/2015

## 2015-08-17 ENCOUNTER — Encounter: Payer: Self-pay | Admitting: Gastroenterology

## 2015-09-15 ENCOUNTER — Ambulatory Visit: Payer: Self-pay | Admitting: Physician Assistant

## 2015-12-19 ENCOUNTER — Telehealth: Payer: Self-pay | Admitting: Gastroenterology

## 2015-12-20 NOTE — Telephone Encounter (Signed)
Left message on machine to call back  

## 2015-12-29 ENCOUNTER — Other Ambulatory Visit: Payer: Self-pay | Admitting: Internal Medicine

## 2016-01-31 ENCOUNTER — Other Ambulatory Visit: Payer: Self-pay | Admitting: Physician Assistant

## 2016-02-12 ENCOUNTER — Telehealth: Payer: Self-pay | Admitting: Internal Medicine

## 2016-02-12 NOTE — Telephone Encounter (Signed)
Left message on machine to call back  

## 2016-02-14 NOTE — Telephone Encounter (Signed)
Last week started to have bright red rectal bleeding occasionally.  Had hem banding last year in October and November, appt with Jeremy Fowler on 02/27/16 10 am

## 2016-02-14 NOTE — Telephone Encounter (Signed)
Cell phone rings fast busy and work number message was left for pt to return call.

## 2016-02-27 ENCOUNTER — Encounter: Payer: Self-pay | Admitting: Gastroenterology

## 2016-02-27 ENCOUNTER — Ambulatory Visit (INDEPENDENT_AMBULATORY_CARE_PROVIDER_SITE_OTHER): Payer: BLUE CROSS/BLUE SHIELD | Admitting: Gastroenterology

## 2016-02-27 VITALS — BP 134/78 | HR 84 | Ht 71.0 in | Wt 214.0 lb

## 2016-02-27 DIAGNOSIS — K648 Other hemorrhoids: Secondary | ICD-10-CM

## 2016-02-27 DIAGNOSIS — K625 Hemorrhage of anus and rectum: Secondary | ICD-10-CM | POA: Insufficient documentation

## 2016-02-27 MED ORDER — HYDROCORTISONE ACETATE 25 MG RE SUPP: RECTAL | Status: DC

## 2016-02-27 NOTE — Progress Notes (Signed)
     02/27/2016 Jeremy Fowler [id number] Nov 10, 1955   History of Present Illness:  This is a 60 year old male who is known to Dr. Ardis Fowler, but had recent hemorrhoid banding in the fall 2016 by Dr. Carlean Fowler x 3. Unfortunately, after the third banding he had a severe fungal perianal dermatitis, which then actually developed throughout his whole-body (unsure if this was related to the banding or not). Anyway, he is here today with recurrent complaints of rectal bleeding. He says that 2 weeks ago he started noticing bright red blood with bowel movements. This was seen in the toilet bowl in a moderate amount. He says that the bleeding has now stopped approximately one week ago and he has not seen any further bleeding since that time. This seems very similar to the bleeding that he had in the past, prior to his hemorrhoid bandings.   Current Medications, Allergies, Past Medical History, Past Surgical History, Family History and Social History were reviewed in [employer] record.   Physical Exam: BP 134/78 mmHg  Pulse 84  Ht 5\' 11"  (1.803 m)  Wt 214 lb (97.07 kg)  BMI 29.86 kg/m2 General: Well developed white male in no acute distress Head: Normocephalic and atraumatic Eyes:  Sclerae anicteric, conjunctiva pink  Ears: Normal auditory acuity Rectal:  No external hemorrhoids or other external abnormalities noted.  DRE did not reveal any masses, no stool on exam glove.  Anoscopy revealed internal hemorrhoids. Musculoskeletal: Symmetrical with no gross deformities  Extremities: No edema  Neurological: Alert oriented x 4, grossly non-focal Psychological:  Alert and cooperative. Normal mood and affect  Assessment and Recommendations: -Recurrent rectal bleeding:  Patient underwent recent hemorrhoid banding 3, but appears to have recurrent internal hemorrhoids on anoscopy exam today.  Will discuss with Dr. Ardis Fowler and also Dr. Carlean Fowler (he performed the banding) to see if re-banding  this soon is an option.  In the interim we will give him hydrocortisone suppositories twice daily to use for 7-10 days if needed.

## 2016-02-27 NOTE — Patient Instructions (Signed)
We are giving you a printed prescription for Anusol HC Suppositories. We will call you later this week after Janett Billow speaks with the physician.

## 2016-02-28 NOTE — Progress Notes (Signed)
I will leave this to Dr. Carlean Purl to consider repeat banding.

## 2016-03-05 ENCOUNTER — Telehealth: Payer: Self-pay | Admitting: Gastroenterology

## 2016-03-05 ENCOUNTER — Telehealth: Payer: Self-pay

## 2016-03-05 ENCOUNTER — Encounter: Payer: Self-pay | Admitting: Internal Medicine

## 2016-03-05 DIAGNOSIS — N3010 Interstitial cystitis (chronic) without hematuria: Secondary | ICD-10-CM

## 2016-03-05 HISTORY — DX: Interstitial cystitis (chronic) without hematuria: N30.10

## 2016-03-05 NOTE — Telephone Encounter (Signed)
Patient is aware and confirmed appt date and time

## 2016-03-05 NOTE — Telephone Encounter (Signed)
Dr. Carlean Purl is going to call the patient.  Thank you,  Jess

## 2016-03-05 NOTE — Progress Notes (Signed)
Spoke to patient and will schedule for banding of hemorrhoids again. Will prophylax with antifungal cream but I really doubt the banding caused dermatitis.

## 2016-03-05 NOTE — Telephone Encounter (Signed)
Patient is asking what next step in care will be. Please, advise.

## 2016-03-05 NOTE — Telephone Encounter (Signed)
-----   Message from Gatha Mayer, MD sent at 03/05/2016 12:20 PM EDT ----- Regarding: appt 6/15 Please set him up for a banding appt 6/15  AM most likely works better for [place]  I can go 0815 if he wants  He flies back in wed PM  Ok to leave VM Call him on work (cell)

## 2016-03-14 ENCOUNTER — Encounter: Payer: Self-pay | Admitting: Internal Medicine

## 2016-03-14 ENCOUNTER — Ambulatory Visit (INDEPENDENT_AMBULATORY_CARE_PROVIDER_SITE_OTHER): Payer: BLUE CROSS/BLUE SHIELD | Admitting: Internal Medicine

## 2016-03-14 VITALS — BP 124/70 | HR 72 | Ht 71.0 in | Wt 215.0 lb

## 2016-03-14 DIAGNOSIS — K602 Anal fissure, unspecified: Secondary | ICD-10-CM | POA: Diagnosis not present

## 2016-03-14 DIAGNOSIS — K648 Other hemorrhoids: Secondary | ICD-10-CM

## 2016-03-14 DIAGNOSIS — K641 Second degree hemorrhoids: Secondary | ICD-10-CM

## 2016-03-14 HISTORY — DX: Anal fissure, unspecified: K60.2

## 2016-03-14 MED ORDER — AMBULATORY NON FORMULARY MEDICATION: Status: DC

## 2016-03-14 NOTE — Patient Instructions (Addendum)
Diltiazem Gel 2% rectally two-three times a day.  Please call to make a follow up in about 2 months.   HEMORRHOID BANDING PROCEDURE    FOLLOW-UP CARE   1. The procedure you have had should have been relatively painless since the banding of the area involved does not have nerve endings and there is no pain sensation.  The rubber band cuts off the blood supply to the hemorrhoid and the band may fall off as soon as 48 hours after the banding (the band may occasionally be seen in the toilet bowl following a bowel movement). You may notice a temporary feeling of fullness in the rectum which should respond adequately to plain Tylenol or Motrin.  2. Following the banding, avoid strenuous exercise that evening and resume full activity the next day.  A sitz bath (soaking in a warm tub) or bidet is soothing, and can be useful for cleansing the area after bowel movements.     3. To avoid constipation, take two tablespoons of natural wheat bran, natural oat bran, flax, Benefiber or any over the counter fiber supplement and increase your water intake to 7-8 glasses daily.    4. Unless you have been prescribed anorectal medication, do not put anything inside your rectum for two weeks: No suppositories, enemas, fingers, etc.  5. Occasionally, you may have more bleeding than usual after the banding procedure.  This is often from the untreated hemorrhoids rather than the treated one.  Don't be concerned if there is a tablespoon or so of blood.  If there is more blood than this, lie flat with your bottom higher than your head and apply an ice pack to the area. If the bleeding does not stop within a half an hour or if you feel faint, call our office at [phone] or go to the emergency room.  6. Problems are not common; however, if there is a substantial amount of bleeding, severe pain, chills, fever or difficulty passing urine (very rare) or other problems, you should call us at [phone] or report to  the nearest emergency room.  7. Do not stay seated continuously for more than 2-3 hours for a day or two after the procedure.  Tighten your buttock muscles 10-15 times every two hours and take 10-15 deep breaths every 1-2 hours.  Do not spend more than a few minutes on the toilet if you cannot empty your bowel; instead re-visit the toilet at a later time.    We have called in the diltiazem cream to [facility] for you to pick up.   I appreciate the opportunity to care for you. Silvano Rusk, MD, [facility]

## 2016-03-14 NOTE — Assessment & Plan Note (Signed)
Anterior fissure tx w/ diltiazem 2% Info sheet provided

## 2016-03-14 NOTE — Assessment & Plan Note (Signed)
RP banded 

## 2016-03-14 NOTE — Progress Notes (Signed)
   Subjective:    Patient ID: Jeremy Fowler, male    DOB: 1956-05-11, 60 y.o.   MRN: [id number] Cc: rectal bleeding/pain HPI Having recurrent rectal bleeding off and on and some rectal pain with defecation. Has had hemorrhoid banding of all 3 columns in 2016. No more perianal and skin rash - had severe rash perianal and on body after last banding. Says had a unform change at that time and some ? If that was related.  Medications, allergies, past medical history, past surgical history, family history and social history are reviewed and updated in the EMR.  Review of Systems As above    Objective:   Physical Exam BP 124/70 mmHg  Pulse 72  Ht 5\' 11"  (1.803 m)  Wt 215 lb (97.523 kg)  BMI 30.00 kg/m2 NAD    PROCEDURE NOTE: The patient presents with symptomatic grade 2 hemorrhoids, requesting rubber band ligation of his/her hemorrhoidal disease.  All risks, benefits and alternative forms of therapy were described and informed consent was obtained.  Rectal - anterior fissure with sentinel pile and tenderness - mild perianal erythema   In the Left Lateral Decubitus position anoscopic examination revealed grade 2 hemorrhoids in the RP position(s).  The anorectum was pre-medicated with 0.125% NTG The decision was made to band the RP  internal hemorrhoid, and the Spencer was used to perform band ligation without complication.  Digital anorectal examination was then performed to assure proper positioning of the band, and to adjust the banded tissue as required.  The patient was discharged home without pain or other issues.  Dietary and behavioral recommendations were given and along with follow-up instructions.     The following adjunctive treatments were recommended:  Diltiazem 2% topical for fissure  The patient will return in 2 months for  follow-up and possible additional banding as required. No complications were encountered and the patient tolerated the procedure  well.      Assessment & Plan:  Prolapsed internal hemorrhoids, grade 2 RP banded  Anal fissure Anterior fissure tx w/ diltiazem 2% Info sheet provided

## 2016-03-18 ENCOUNTER — Encounter: Payer: Self-pay | Admitting: Physician Assistant

## 2016-03-18 ENCOUNTER — Ambulatory Visit (INDEPENDENT_AMBULATORY_CARE_PROVIDER_SITE_OTHER): Payer: BLUE CROSS/BLUE SHIELD | Admitting: Physician Assistant

## 2016-03-18 VITALS — BP 130/80 | HR 65 | Temp 97.5°F | Resp 16 | Ht 71.0 in | Wt 216.2 lb

## 2016-03-18 DIAGNOSIS — K641 Second degree hemorrhoids: Secondary | ICD-10-CM

## 2016-03-18 DIAGNOSIS — K602 Anal fissure, unspecified: Secondary | ICD-10-CM

## 2016-03-18 DIAGNOSIS — N3010 Interstitial cystitis (chronic) without hematuria: Secondary | ICD-10-CM

## 2016-03-18 DIAGNOSIS — Z79899 Other long term (current) drug therapy: Secondary | ICD-10-CM

## 2016-03-18 DIAGNOSIS — B009 Herpesviral infection, unspecified: Secondary | ICD-10-CM

## 2016-03-18 DIAGNOSIS — E785 Hyperlipidemia, unspecified: Secondary | ICD-10-CM

## 2016-03-18 DIAGNOSIS — Z0000 Encounter for general adult medical examination without abnormal findings: Secondary | ICD-10-CM

## 2016-03-18 DIAGNOSIS — Z800 Family history of malignant neoplasm of digestive organs: Secondary | ICD-10-CM

## 2016-03-18 DIAGNOSIS — I10 Essential (primary) hypertension: Secondary | ICD-10-CM

## 2016-03-18 DIAGNOSIS — Z860100 Personal history of colon polyps, unspecified: Secondary | ICD-10-CM

## 2016-03-18 DIAGNOSIS — G4700 Insomnia, unspecified: Secondary | ICD-10-CM

## 2016-03-18 DIAGNOSIS — E559 Vitamin D deficiency, unspecified: Secondary | ICD-10-CM | POA: Diagnosis not present

## 2016-03-18 DIAGNOSIS — Z0001 Encounter for general adult medical examination with abnormal findings: Secondary | ICD-10-CM

## 2016-03-18 DIAGNOSIS — Z85828 Personal history of other malignant neoplasm of skin: Secondary | ICD-10-CM

## 2016-03-18 DIAGNOSIS — N400 Enlarged prostate without lower urinary tract symptoms: Secondary | ICD-10-CM

## 2016-03-18 DIAGNOSIS — F17200 Nicotine dependence, unspecified, uncomplicated: Secondary | ICD-10-CM

## 2016-03-18 LAB — LIPID PANEL
CHOL/HDL RATIO: 5.8 ratio — AB (ref <=5.0)
Cholesterol: 214 mg/dL — ABNORMAL HIGH (ref 125–200)
HDL: 37 mg/dL — AB (ref >=40)
LDL CALC: 111 mg/dL (ref <130)
Triglycerides: 331 mg/dL — ABNORMAL HIGH (ref <150)
VLDL: 66 mg/dL — AB (ref <30)

## 2016-03-18 LAB — HEPATIC FUNCTION PANEL
ALBUMIN: 4.5 g/dL (ref 3.6–5.1)
ALK PHOS: 72 U/L (ref 40–115)
ALT: 38 U/L (ref 9–46)
AST: 24 U/L (ref 10–35)
BILIRUBIN INDIRECT: 0.7 mg/dL (ref 0.2–1.2)
Bilirubin, Direct: 0.1 mg/dL (ref <=0.2)
TOTAL PROTEIN: 6.9 g/dL (ref 6.1–8.1)
Total Bilirubin: 0.8 mg/dL (ref 0.2–1.2)

## 2016-03-18 LAB — CBC WITH DIFFERENTIAL/PLATELET
Basophils Absolute: 58 cells/uL (ref 0–200)
Basophils Relative: 1 %
Eosinophils Absolute: 116 cells/uL (ref 15–500)
Eosinophils Relative: 2 %
HEMATOCRIT: 44.5 % (ref 38.5–50.0)
HEMOGLOBIN: 15.4 g/dL (ref 13.2–17.1)
LYMPHS ABS: 1682 {cells}/uL (ref 850–3900)
Lymphocytes Relative: 29 %
MCH: 32.8 pg (ref 27.0–33.0)
MCHC: 34.6 g/dL (ref 32.0–36.0)
MCV: 94.9 fL (ref 80.0–100.0)
MONO ABS: 522 {cells}/uL (ref 200–950)
MPV: 10.7 fL (ref 7.5–12.5)
Monocytes Relative: 9 %
Neutro Abs: 3422 cells/uL (ref 1500–7800)
Neutrophils Relative %: 59 %
Platelets: 178 10*3/uL (ref 140–400)
RBC: 4.69 MIL/uL (ref 4.20–5.80)
RDW: 14 % (ref 11.0–15.0)
WBC: 5.8 10*3/uL (ref 3.8–10.8)

## 2016-03-18 LAB — BASIC METABOLIC PANEL WITH GFR
BUN: 19 mg/dL (ref 7–25)
CALCIUM: 9.3 mg/dL (ref 8.6–10.3)
CO2: 25 mmol/L (ref 20–31)
Chloride: 105 mmol/L (ref 98–110)
Creat: 0.91 mg/dL (ref 0.70–1.33)
GFR, Est African American: >89 mL/min (ref >=60)
GLUCOSE: 92 mg/dL (ref 65–99)
Potassium: 4.1 mmol/L (ref 3.5–5.3)
Sodium: 139 mmol/L (ref 135–146)

## 2016-03-18 LAB — TSH: TSH: 1.51 mIU/L (ref 0.40–4.50)

## 2016-03-18 LAB — MAGNESIUM: MAGNESIUM: 2.1 mg/dL (ref 1.5–2.5)

## 2016-03-18 MED ORDER — ACYCLOVIR 400 MG PO TABS: 400.0000 mg | ORAL_TABLET | Freq: Two times a day (BID) | ORAL | Status: DC

## 2016-03-18 NOTE — Progress Notes (Signed)
Complete Physical  Assessment and Plan: 1. Essential hypertension - continue medications, DASH diet, exercise and monitor at home. Call if greater than 130/80.  - CBC with Differential/Platelet - BASIC METABOLIC PANEL WITH GFR - Hepatic function panel - TSH - Urinalysis, Routine w reflex microscopic (not at [facility]) - Microalbumin / creatinine urine ratio  2. Hyperlipemia -continue medications check lipids, decrease fatty foods, increase activity.  - Lipid panel - Hemoglobin A1c - Insulin, fasting  3. BPH (benign prostatic hyperplasia) Continue medications and follow up Dr. Gaynelle Arabian   4. History of basal cell carcinoma Follow up with Dr. Tonia Brooms  5. Insomnia lunesta refilled  6. Hx of colonic polyps Due 2023  7. HSV-2 infection Refill acyclovir  8. Internal hemorrhoids Refill suppository PRN, follow up Dr. Carlean Purl  9. Family hx of colon cancer monitor  10. Vitamin D deficiency - Vit D  25 hydroxy (rtn osteoporosis monitoring)  11. Medication management - Magnesium  12. Anal Fissure - Increase fiber/ water intake, decrease caffeine, increase activity level  13. Tobacco use disorder Counseled on smoking cessation   Discussed med's effects and SE's. Screening labs and tests as requested with regular follow-up as recommended.  HPI 60 y.o. male  presents for a complete physical. His blood pressure has been controlled at home, today their BP is BP: 130/80 mmHg He does workout, elliptical, weight lifting. He denies chest pain, shortness of breath, dizziness. He gets an EKG once a year with Dr. 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 Declines aorta scan, will get at age 55 due to cigar use.  He is on cholesterol medication. His cholesterol is not at goal. The cholesterol last visit was:   Lab Results  Component Value Date   CHOL 192 03/17/2015   HDL 38* 03/17/2015   LDLCALC 112* 03/17/2015   TRIG 211* 03/17/2015   CHOLHDL 5.1 03/17/2015    Last A1C in the office was:  Lab Results  Component Value Date   HGBA1C 5.4 03/17/2015   Patient is on Vitamin D supplement. Lab Results  Component Value Date   VD25OH 33 03/17/2015   Has seens Dr. Tonia Brooms [facility] on nose.  Has had severe hemorrhoids/bandings, and currently being treated for anal fissure.  Has seen Dr. Gaynelle Arabian recently, diagnosed with ICS and he checks his prostate, he is on alfuzosin.  He is on lunesta for insomnia.  BMI is Body mass index is 30.17 kg/(m^2)., he is working on diet and exercise. Wt Readings from Last 3 Encounters:  03/18/16 216 lb 3.2 oz (98.068 kg)  03/14/16 215 lb (97.523 kg)  02/27/16 214 lb (97.07 kg)    Current Medications:  Current Outpatient Prescriptions on File Prior to Visit  Medication Sig Dispense Refill  . acyclovir (ZOVIRAX) 400 MG tablet Take 1 tablet (400 mg total) by mouth 2 (two) times daily. (Patient taking differently: Take 400 mg by mouth 2 (two) times daily as needed. ) 180 tablet 1  . alfuzosin (UROXATRAL) 10 MG 24 hr tablet Take 1 tablet (10 mg total) by mouth daily with breakfast. 90 tablet 3  . AMBULATORY NON FORMULARY MEDICATION Medication Name: diltiazem cream (Patient taking differently: Medication Name: diltiazem cream 2% , apply to rectum bid-tid and after bowel movement when possible) 30 g 3  . Eszopiclone 3 MG TABS TAKE 1 TABLET BY MOUTH AT BEDTIME AS NEEDED 90 tablet 1  . hydrocortisone (ANUSOL-HC) 25 MG suppository Place 1 suppository twice daily x 10 days. 20 suppository 2  No current facility-administered medications on file prior to visit.   Health Maintenance:  Immunization History  Administered Date(s) Administered  . Td 01/22/2007   Tetanus: 2008 due next year Pneumovax: N/A Prevnar 13: N/A Flu vaccine: 2014 Zostavax: N/A DEXA: N/A Colonoscopy: 2013 Deatra Ina) - due 10 years EGD: N/A CT cervical: 2009 CXR 2013 CT head 2009 DEE, Dr. Bing Plume- yearly  Patient Care Team: Unk Pinto, MD as PCP  - General (Internal Medicine) Inda Castle, MD as Consulting Physician (Gastroenterology) Carolan Clines, MD as Consulting Physician (Urology) Marybelle Killings, MD as Consulting Physician (Orthopedic Surgery) Crista Luria, MD as Consulting Physician (Dermatology)  Allergies:  Allergies  Allergen Reactions  . Sulfa Antibiotics     dizzy  . Sulfur Other (See Comments)    disoriented   Medical History:  Past Medical History  Diagnosis Date  . Colon polyp 04-28-2002    Colonoscopy   . Prostatitis   . Hyperlipemia   . Hypertension   . Insomnia   . HSV-2 infection   . Prolapsed internal hemorrhoids, grade 2 08/04/2012    Grade 2 hemorrhoids  05/11/15 band ligation right posterior hemorrhoidal bundle 06/14/15 band ligation right anterior hemorrhoidal bundle 07/13/15 LL banded   . Interstitial cystitis 03/05/2016   Surgical History:  Past Surgical History  Procedure Laterality Date  . Calcaneal osteotomy w/ internal fixation    . Colonoscopy  08/04/2012    Procedure: COLONOSCOPY;  Surgeon: Inda Castle, MD;  Location: [facility] ENDOSCOPY;  Service: Endoscopy;  Laterality: N/A;  with possible banding  . Hemorrhoid banding    . Hemorrhoid banding  2016   Family History:  Family History  Problem Relation Age of Onset  . Colon cancer Maternal Grandmother     dx in her late 75's  . Cancer Mother    Social History:   Social History  Substance Use Topics  . Smoking status: Light Tobacco Smoker    Types: Cigars  . Smokeless tobacco: Never Used     Comment: Tobacco info given 04/17/15  . Alcohol Use: 1.2 oz/week    2 Glasses of wine per week   Review of Systems  Constitutional: Negative.   HENT: Negative.   Eyes: Negative.   Respiratory: Negative.   Cardiovascular: Negative.   Gastrointestinal: Positive for blood in stool. Negative for heartburn, nausea, vomiting, abdominal pain, diarrhea, constipation and melena.  Genitourinary: Negative.   Musculoskeletal: Positive for myalgias.  Negative for back pain, joint pain, falls and neck pain.  Skin: Negative.   Neurological: Negative.   Endo/Heme/Allergies: Negative.   Psychiatric/Behavioral: Negative.      Physical Exam: Estimated body mass index is 30.17 kg/(m^2) as calculated from the following:   Height as of this encounter: 5\' 11"  (1.803 m).   Weight as of this encounter: 216 lb 3.2 oz (98.068 kg). BP 130/80 mmHg  Pulse 65  Temp(Src) 97.5 F (36.4 C) (Temporal)  Resp 16  Ht 5\' 11"  (1.803 m)  Wt 216 lb 3.2 oz (98.068 kg)  BMI 30.17 kg/m2  SpO2 97% General Appearance: Well nourished, in no apparent distress. Eyes: PERRLA, EOMs, conjunctiva no swelling or erythema, normal fundi and vessels. Sinuses: No Frontal/maxillary tenderness ENT/Mouth: Ext aud canals clear, normal light reflex with TMs without erythema, bulging. Good dentition. No erythema, swelling, or exudate on post pharynx. Tonsils not swollen or erythematous. Hearing normal.  Neck: Supple, thyroid normal. No bruits Respiratory: Respiratory effort normal, BS equal bilaterally without rales, rhonchi, wheezing or stridor. Cardio: RRR without  murmurs, rubs or gallops. Brisk peripheral pulses without edema.  Chest: symmetric, with normal excursions and percussion. Abdomen: Soft, +BS. Non tender, no guarding, rebound, hernias, masses, or organomegaly.  Lymphatics: Non tender without lymphadenopathy.  Genitourinary: defer Dr. Gaynelle Arabian Musculoskeletal: Full ROM all peripheral extremities,5/5 strength, and normal gait. Skin: Warm, dry without rashes, lesions, ecchymosis.  Neuro: Cranial nerves intact, reflexes equal bilaterally. Normal muscle tone, no cerebellar symptoms. Sensation intact.  Psych: Awake and oriented X 3, normal affect, Insight and Judgment appropriate.   EKG: defer Aorta Scan: defer until age 32  Vicie Mutters 9:21 AM

## 2016-03-18 NOTE — Patient Instructions (Signed)
Tick Bite Information Ticks are insects that attach themselves to the skin and draw blood for food. There are various types of ticks. Common types include wood ticks and deer ticks. Most ticks live in shrubs and grassy areas. Ticks can climb onto your body when you make contact with leaves or grass where the tick is waiting. The most common places on the body for ticks to attach themselves are the scalp, neck, armpits, waist, and groin. Most tick bites are harmless, but sometimes ticks carry germs that cause diseases. These germs can be spread to a person during the tick's feeding process. The chance of a disease spreading through a tick bite depends on:   The type of tick.  Time of year.   How long the tick is attached.   Geographic location.  HOW CAN YOU PREVENT TICK BITES? Take these steps to help prevent tick bites when you are outdoors:  Wear protective clothing. Long sleeves and long pants are best.   Wear white clothes so you can see ticks more easily.  Tuck your pant legs into your socks.   If walking on a trail, stay in the middle of the trail to avoid brushing against bushes.  Avoid walking through areas with long grass.  Put insect repellent on all exposed skin and along boot tops, pant legs, and sleeve cuffs.   Check clothing, hair, and skin repeatedly and before going inside.   Brush off any ticks that are not attached.  Take a shower or bath as soon as possible after being outdoors.  WHAT IS THE PROPER WAY TO REMOVE A TICK? Ticks should be removed as soon as possible to help prevent diseases caused by tick bites. 1. If latex gloves are available, put them on before trying to remove a tick.  2. Using fine-point tweezers, grasp the tick as close to the skin as possible. You may also use curved forceps or a tick removal tool. Grasp the tick as close to its head as possible. Avoid grasping the tick on its body. 3. Pull gently with steady upward pressure until  the tick lets go. Do not twist the tick or jerk it suddenly. This may break off the tick's head or mouth parts. 4. Do not squeeze or crush the tick's body. This could force disease-carrying fluids from the tick into your body.  5. After the tick is removed, wash the bite area and your hands with soap and water or other disinfectant such as alcohol. 6. Apply a small amount of antiseptic cream or ointment to the bite site.  7. Wash and disinfect any instruments that were used.  Do not try to remove a tick by applying a hot match, petroleum jelly, or fingernail polish to the tick. These methods do not work and may increase the chances of disease being spread from the tick bite.  WHEN SHOULD YOU SEEK MEDICAL CARE? Contact your health care provider if you are unable to remove a tick from your skin or if a part of the tick breaks off and is stuck in the skin.  After a tick bite, you need to be aware of signs and symptoms that could be related to diseases spread by ticks. Contact your health care provider if you develop any of the following in the days or weeks after the tick bite:  Unexplained fever.  Rash. A circular rash that appears days or weeks after the tick bite may indicate the possibility of Lyme disease. The rash may resemble   a target with a bull's-eye and may occur at a different part of your body than the tick bite.  Redness and swelling in the area of the tick bite.   Tender, swollen lymph glands.   Diarrhea.   Weight loss.   Cough.   Fatigue.   Muscle, joint, or bone pain.   Abdominal pain.   Headache.   Lethargy or a change in your level of consciousness.  Difficulty walking or moving your legs.   Numbness in the legs.   Paralysis.  Shortness of breath.   Confusion.   Repeated vomiting.    This information is not intended to replace advice given to you by your health care provider. Make sure you discuss any questions you have with your health  care provider.   Document Released: 09/13/2000 Document Revised: 10/07/2014 Document Reviewed: 02/24/2013 Elsevier Interactive Patient Education 2016 [employer]  

## 2016-03-19 LAB — URINALYSIS, ROUTINE W REFLEX MICROSCOPIC
BILIRUBIN URINE: NEGATIVE
GLUCOSE, UA: NEGATIVE
HGB URINE DIPSTICK: NEGATIVE
KETONES UR: NEGATIVE
LEUKOCYTES UA: NEGATIVE
Nitrite: NEGATIVE
PH: 6 (ref 5.0–8.0)
Protein, ur: NEGATIVE
Specific Gravity, Urine: 1.013 (ref 1.001–1.035)

## 2016-03-19 LAB — MICROALBUMIN / CREATININE URINE RATIO
Creatinine, Urine: 81 mg/dL (ref 20–370)
MICROALB/CREAT RATIO: 2 ug/mg{creat} (ref <30)
Microalb, Ur: 0.2 mg/dL

## 2016-03-19 LAB — VITAMIN D 25 HYDROXY (VIT D DEFICIENCY, FRACTURES): Vit D, 25-Hydroxy: 28 ng/mL — ABNORMAL LOW (ref 30–100)

## 2016-04-05 ENCOUNTER — Encounter: Payer: BLUE CROSS/BLUE SHIELD | Admitting: Internal Medicine

## 2016-04-07 ENCOUNTER — Other Ambulatory Visit: Payer: Self-pay | Admitting: Physician Assistant

## 2016-06-10 ENCOUNTER — Other Ambulatory Visit: Payer: Self-pay | Admitting: Physician Assistant

## 2016-09-17 ENCOUNTER — Ambulatory Visit: Payer: Self-pay | Admitting: Physician Assistant

## 2016-10-05 ENCOUNTER — Other Ambulatory Visit: Payer: Self-pay | Admitting: Internal Medicine

## 2016-11-08 ENCOUNTER — Encounter: Payer: Self-pay | Admitting: Podiatry

## 2016-11-08 ENCOUNTER — Ambulatory Visit (INDEPENDENT_AMBULATORY_CARE_PROVIDER_SITE_OTHER): Payer: BLUE CROSS/BLUE SHIELD

## 2016-11-08 ENCOUNTER — Ambulatory Visit (INDEPENDENT_AMBULATORY_CARE_PROVIDER_SITE_OTHER): Payer: BLUE CROSS/BLUE SHIELD | Admitting: Podiatry

## 2016-11-08 DIAGNOSIS — M7752 Other enthesopathy of left foot: Secondary | ICD-10-CM | POA: Diagnosis not present

## 2016-11-08 DIAGNOSIS — M779 Enthesopathy, unspecified: Secondary | ICD-10-CM

## 2016-11-08 DIAGNOSIS — M722 Plantar fascial fibromatosis: Secondary | ICD-10-CM | POA: Diagnosis not present

## 2016-11-08 DIAGNOSIS — L600 Ingrowing nail: Secondary | ICD-10-CM | POA: Diagnosis not present

## 2016-11-08 MED ORDER — MELOXICAM 15 MG PO TABS: 15.0000 mg | ORAL_TABLET | Freq: Every day | ORAL | 2 refills | Status: DC

## 2016-11-08 MED ORDER — TRIAMCINOLONE ACETONIDE 10 MG/ML IJ SUSP
10.0000 mg | Freq: Once | INTRAMUSCULAR | Status: AC
  Administered 2016-11-08: 10 mg

## 2016-11-08 NOTE — Patient Instructions (Signed)

## 2016-11-11 NOTE — Progress Notes (Signed)
Subjective: 61 year old male presents the also concerns of left heel pain as well as pain in the arch which is been ongoing for several weeks has been worsening over the last 3 weeks. He denies any recent injury or trauma. Patient states it worse in the morning when he first gets up and after he sits for some time and stands back. He denies any recent injury or trauma. He has been stretching but no other treatment. He also is concerned about possible ingrown toenail to the right big toe. Denies any systemic complaints such as fevers, chills, nausea, vomiting. No acute changes since last appointment, and no other complaints at this time.   Objective: AAO x3, NAD DP/PT pulses palpable bilaterally, CRT less than 3 seconds Tenderness to palpation along the plantar medial tubercle of the calcaneus at the insertion of plantar fascia on the left foot. There is mild pain along the course of the plantar fascia within the arch of the foot. Plantar fascia appears to be intact. There is no pain with lateral compression of the calcaneus or pain with vibratory sensation. There is no pain along the course or insertion of the achilles tendon. No other areas of tenderness to bilateral lower extremities. On the right hallux or does appear to be incurvation along the lateral nail border worse than the medial nail border. There is minimal edema there is no erythema or increase in warmth. Minimal tenderness to palpation of the toenail. No drainage or pus or ascending synovitis. No open lesions or pre-ulcerative lesions.  No pain with calf compression, swelling, warmth, erythema  Assessment: Left heel pain, plantar fasciitis; right ingrown toenail  Plan: -All treatment options discussed with the patient including all alternatives, risks, complications.  -Etiology of symptoms were discussed -X-rays were obtained and reviewed with the patient. No evidence acute fracture identified. -Patient elects to proceed with steroid  injection into the left heel. Under sterile skin preparation, a total of 2.5cc of kenalog 10, 0.5% Marcaine plain, and 2% lidocaine plain were infiltrated into the symptomatic area without complication. A band-aid was applied. Patient tolerated the injection well without complication. Post-injection care with discussed with the patient. Discussed with the patient to ice the area over the next couple of days to help prevent a steroid flare.  -Plantar fascial brace dispensed -Prescribed mobic. Discussed side effects of the medication and directed to stop if any are to occur and call the office.  -Stretching, icing daily -Discussed partial nail avulsion of the right hallux. Declined this. The nails debrided today without complications or bleeding. If symptoms continue will likely need a partial nail avulsion.  -Patient encouraged to call the office with any questions, concerns, change in symptoms.   Celesta Gentile, DPM

## 2016-11-29 ENCOUNTER — Ambulatory Visit: Payer: BLUE CROSS/BLUE SHIELD | Admitting: Podiatry

## 2017-02-14 ENCOUNTER — Other Ambulatory Visit: Payer: Self-pay | Admitting: Physician Assistant

## 2017-02-18 ENCOUNTER — Other Ambulatory Visit: Payer: Self-pay | Admitting: Internal Medicine

## 2017-02-18 NOTE — Telephone Encounter (Signed)
Please call Lunesta (Eszopiclone)

## 2017-03-18 ENCOUNTER — Encounter: Payer: Self-pay | Admitting: Gastroenterology

## 2017-03-19 ENCOUNTER — Encounter: Payer: Self-pay | Admitting: Physician Assistant

## 2017-04-03 ENCOUNTER — Other Ambulatory Visit: Payer: Self-pay | Admitting: Physician Assistant

## 2017-05-17 NOTE — Progress Notes (Signed)
Complete Physical  Assessment and Plan:  Essential hypertension - continue medications, DASH diet, exercise and monitor at home. Call if greater than 130/80.  -     CBC with Differential/Platelet -     BASIC METABOLIC PANEL WITH GFR -     Hepatic function panel -     TSH -     Urinalysis, Routine w reflex microscopic -     Microalbumin / creatinine urine ratio  Hyperlipidemia, unspecified hyperlipidemia type -continue medications, check lipids, decrease fatty foods, increase activity.  -     Lipid panel  Medication management -     Magnesium  Tobacco use disorder Counseled to quit  Vitamin D deficiency -     VITAMIN D 25 Hydroxy (Vit-D Deficiency, Fractures)  Prolapsed internal hemorrhoids, grade 2 Follow up as needed  Anal fissure Follow up as needed  History of basal cell carcinoma Follows with derm  Benign prostatic hyperplasia with urinary frequency -     Testosterone -     PSA  Interstitial cystitis Decrease foods  Hx of colonic polyps Continue monitor  Family hx of colon cancer Continue to monitor  Insomnia, unspecified type Continue lunesta  HSV-2 infection No outbreakds  Discussed med's effects and SE's. Screening labs and tests as requested with regular follow-up as recommended.  HPI 61 y.o. male  presents for a complete physical. His blood pressure has been controlled at home, today their BP is BP: 136/80 He does workout, elliptical, weight lifting. He denies chest pain, shortness of breath, dizziness. 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 Declines aorta scan, will get at age 54 due to cigar use.  He has been having right ear pain, has improved but will still notice with flying occ.  Has had frontal pain, left eye pain, better with ibuprofen and decongestant and cold compress. No changes in vision with it, no changes in speech, no weakness.  He is on cholesterol medication. His cholesterol is not at goal.  The cholesterol last visit was:   Lab Results  Component Value Date   CHOL 214 (H) 03/18/2016   HDL 37 (L) 03/18/2016   LDLCALC 111 03/18/2016   TRIG 331 (H) 03/18/2016   CHOLHDL 5.8 (H) 03/18/2016   Last A1C in the office was:  Lab Results  Component Value Date   HGBA1C 5.4 03/17/2015   Patient is on Vitamin D supplement only in multivitamin. Lab Results  Component Value Date   VD25OH 28 (L) 03/18/2016   He follows with Dr. Tonia Brooms, but now following with people in her office, saw 5-6 months ago.  Has had severe hemorrhoids/bandings, and currently being treated for anal fissure.  Has seen Dr. Gaynelle Arabian recently, diagnosed with ICS and he checks his prostate, he is on alfuzosin and doing well.   He is on lunesta for insomnia.  BMI is Body mass index is 29.65 kg/m., he is working on diet and exercise. Wt Readings from Last 3 Encounters:  05/19/17 212 lb 9.6 oz (96.4 kg)  03/18/16 216 lb 3.2 oz (98.1 kg)  03/14/16 215 lb (97.5 kg)    Current Medications:  Current Outpatient Prescriptions on File Prior to Visit  Medication Sig Dispense Refill  . acyclovir (ZOVIRAX) 400 MG tablet TAKE 1 TABLET TWICE A DAY 180 tablet 1  . alfuzosin (UROXATRAL) 10 MG 24 hr tablet TAKE 1 TABLET DAILY WITH BREAKFAST 90 tablet 1  . AMBULATORY NON FORMULARY MEDICATION Medication Name: diltiazem  cream (Patient taking differently: Medication Name: diltiazem cream 2% , apply to rectum bid-tid and after bowel movement when possible) 30 g 3  . ANUCORT-HC 25 MG suppository INSERT ONE SUPPOSITORY RECTALLY THREE TIMES DAILY TO 4 TIMES DAILY 12 suppository 3  . Eszopiclone 3 MG TABS TAKE 1 TABLET AT BEDTIME AS NEEDED 90 tablet 0  . meloxicam (MOBIC) 15 MG tablet Take 1 tablet (15 mg total) by mouth daily. 30 tablet 2  . pravastatin (PRAVACHOL) 10 MG tablet Take 10 mg by mouth.     No current facility-administered medications on file prior to visit.    Health Maintenance:  Immunization History  Administered  Date(s) Administered  . Td 01/22/2007   Tetanus: 2008 out of it right now Pneumovax: N/A Prevnar 13: N/A Flu vaccine: 2014 Zostavax: N/A DEXA: N/A  Colonoscopy: 2013 Deatra Ina) - due 10 years EGD: N/A CT cervical: 2009 CXR 2013 CT head 2009 DEE, Dr. Bing Plume- yearly  Patient Care Team: Unk Pinto, MD as PCP - General (Internal Medicine) Inda Castle, MD as Consulting Physician (Gastroenterology) Carolan Clines, MD as Consulting Physician (Urology) Marybelle Killings, MD as Consulting Physician (Orthopedic Surgery) Crista Luria, MD as Consulting Physician (Dermatology)  Medical History:  Past Medical History:  Diagnosis Date  . Colon polyp 04-28-2002   Colonoscopy   . HSV-2 infection   . Hyperlipemia   . Hypertension   . Insomnia   . Interstitial cystitis 03/05/2016  . Prolapsed internal hemorrhoids, grade 2 08/04/2012   Grade 2 hemorrhoids  05/11/15 band ligation right posterior hemorrhoidal bundle 06/14/15 band ligation right anterior hemorrhoidal bundle 07/13/15 LL banded   . Prostatitis    Allergies Allergies  Allergen Reactions  . Sulfa Antibiotics     dizzy  . Sulfur Other (See Comments)    disoriented    SURGICAL HISTORY He  has a past surgical history that includes Calcaneal osteotomy w/ internal fixation; Colonoscopy (08/04/2012); Hemorrhoid banding; and Hemorrhoid banding (2016). FAMILY HISTORY His family history includes Cancer in his mother; Colon cancer in his maternal grandmother. SOCIAL HISTORY He  reports that he has been smoking Cigars.  He has never used smokeless tobacco. He reports that he drinks about 1.2 oz of alcohol per week . He reports that he does not use drugs.   Review of Systems  Constitutional: Negative.   HENT: Negative.   Eyes: Negative.   Respiratory: Negative.   Cardiovascular: Negative.   Gastrointestinal: Negative for abdominal pain, blood in stool, constipation, diarrhea, heartburn, melena, nausea and vomiting.   Genitourinary: Negative.   Musculoskeletal: Negative for back pain, falls, joint pain, myalgias and neck pain.  Skin: Negative.   Neurological: Negative.   Endo/Heme/Allergies: Negative.   Psychiatric/Behavioral: Negative.    Physical Exam: Estimated body mass index is 29.65 kg/m as calculated from the following:   Height as of this encounter: 5\' 11"  (1.803 m).   Weight as of this encounter: 212 lb 9.6 oz (96.4 kg). BP 136/80   Pulse 62   Temp 97.7 F (36.5 C)   Resp 14   Ht 5\' 11"  (1.803 m)   Wt 212 lb 9.6 oz (96.4 kg)   SpO2 96%   BMI 29.65 kg/m  General Appearance: Well nourished, in no apparent distress. Eyes: PERRLA, EOMs, conjunctiva no swelling or erythema, normal fundi and vessels. Sinuses: No Frontal/maxillary tenderness ENT/Mouth: Ext aud canals clear, normal light reflex with TMs without erythema, bulging. Good dentition. No erythema, swelling, or exudate on post pharynx. Tonsils not swollen  or erythematous. Hearing normal.  Neck: Supple, thyroid normal. No bruits Respiratory: Respiratory effort normal, BS equal bilaterally without rales, rhonchi, wheezing or stridor. Cardio: RRR without murmurs, rubs or gallops. Brisk peripheral pulses without edema.  Chest: symmetric, with normal excursions and percussion. Abdomen: Soft, +BS. Non tender, no guarding, rebound, hernias, masses, or organomegaly.  Lymphatics: Non tender without lymphadenopathy.  Genitourinary: defer Dr. Gaynelle Arabian Musculoskeletal: Full ROM all peripheral extremities,5/5 strength, and normal gait. Skin: Warm, dry without rashes, lesions, ecchymosis.  Neuro: Cranial nerves intact, reflexes equal bilaterally. Normal muscle tone, no cerebellar symptoms. Sensation intact.  Psych: Awake and oriented X 3, normal affect, Insight and Judgment appropriate.   EKG: defer Aorta Scan: defer until age 61  Vicie Mutters 11:45 AM

## 2017-05-19 ENCOUNTER — Encounter: Payer: Self-pay | Admitting: Physician Assistant

## 2017-05-19 ENCOUNTER — Ambulatory Visit (INDEPENDENT_AMBULATORY_CARE_PROVIDER_SITE_OTHER): Payer: BLUE CROSS/BLUE SHIELD | Admitting: Physician Assistant

## 2017-05-19 VITALS — BP 136/80 | HR 62 | Temp 97.7°F | Resp 14 | Ht 71.0 in | Wt 212.6 lb

## 2017-05-19 DIAGNOSIS — N401 Enlarged prostate with lower urinary tract symptoms: Secondary | ICD-10-CM | POA: Diagnosis not present

## 2017-05-19 DIAGNOSIS — E559 Vitamin D deficiency, unspecified: Secondary | ICD-10-CM | POA: Diagnosis not present

## 2017-05-19 DIAGNOSIS — F17200 Nicotine dependence, unspecified, uncomplicated: Secondary | ICD-10-CM | POA: Diagnosis not present

## 2017-05-19 DIAGNOSIS — B009 Herpesviral infection, unspecified: Secondary | ICD-10-CM

## 2017-05-19 DIAGNOSIS — E785 Hyperlipidemia, unspecified: Secondary | ICD-10-CM

## 2017-05-19 DIAGNOSIS — R350 Frequency of micturition: Secondary | ICD-10-CM

## 2017-05-19 DIAGNOSIS — Z85828 Personal history of other malignant neoplasm of skin: Secondary | ICD-10-CM | POA: Diagnosis not present

## 2017-05-19 DIAGNOSIS — N3010 Interstitial cystitis (chronic) without hematuria: Secondary | ICD-10-CM | POA: Diagnosis not present

## 2017-05-19 DIAGNOSIS — Z79899 Other long term (current) drug therapy: Secondary | ICD-10-CM

## 2017-05-19 DIAGNOSIS — Z0001 Encounter for general adult medical examination with abnormal findings: Secondary | ICD-10-CM | POA: Diagnosis not present

## 2017-05-19 DIAGNOSIS — K602 Anal fissure, unspecified: Secondary | ICD-10-CM | POA: Diagnosis not present

## 2017-05-19 DIAGNOSIS — Z800 Family history of malignant neoplasm of digestive organs: Secondary | ICD-10-CM

## 2017-05-19 DIAGNOSIS — K641 Second degree hemorrhoids: Secondary | ICD-10-CM

## 2017-05-19 DIAGNOSIS — I10 Essential (primary) hypertension: Secondary | ICD-10-CM | POA: Diagnosis not present

## 2017-05-19 DIAGNOSIS — G4700 Insomnia, unspecified: Secondary | ICD-10-CM

## 2017-05-19 DIAGNOSIS — Z86010 Personal history of colonic polyps: Secondary | ICD-10-CM | POA: Diagnosis not present

## 2017-05-19 NOTE — Patient Instructions (Addendum)
Add on vitamin D 2000 IU   Vitamin D goal is between 60-80  Please make sure that you are taking your Vitamin D as directed.   It is very important as a natural anti-inflammatory   helping hair, skin, and nails, as well as reducing stroke and heart attack risk.   It helps your bones and helps with mood.  It also decreases numerous cancer risks so please take it as directed.   Low Vit D is associated with a 200-300% higher risk for CANCER   and 200-300% higher risk for HEART   ATTACK  &  STROKE.    .....................................Marland Kitchen  It is also associated with higher death rate at younger ages,   autoimmune diseases like Rheumatoid arthritis, Lupus, Multiple Sclerosis.     Also many other serious conditions, like depression, Alzheimer's  Dementia, infertility, muscle aches, fatigue, fibromyalgia - just to name a few.  +++++++++++++++++++  Can get liquid vitamin D from [facility] here in [place] at  [facility] alternatives [address] [place], [place] [address] Or you can try earth fare    Your ears and sinuses are connected by the eustachian tube. When your sinuses are inflamed, this can close off the tube and cause fluid to collect in your middle ear. This can then cause dizziness, popping, clicking, ringing, and echoing in your ears. This is often NOT an infection and does NOT require antibiotics, it is caused by inflammation so the treatments help the inflammation. This can take a long time to get better so please be patient.  Here are things you can do to help with this: - Try the Flonase or Nasonex. Remember to spray each nostril twice towards the outer part of your eye.  Do not sniff but instead pinch your nose and tilt your head back to help the medicine get into your sinuses.  The best time to do this is at bedtime.Stop if you get blurred vision or nose bleeds.  -While drinking fluids, pinch and hold nose close and swallow, to help open eustachian tubes to drain  fluid behind ear drums. -Please pick one of the over the counter allergy medications below and take it once daily for allergies.  It will also help with fluid behind ear drums. Claritin or loratadine cheapest but likely the weakest  Zyrtec or certizine at night because it can make you sleepy The strongest is allegra or fexafinadine  Cheapest at [employer], [employer], [employer] -can use decongestant over the counter, please do not use if you have high blood pressure or certain heart conditions.   if worsening HA, changes vision/speech, imbalance, weakness go to the ER    Cluster Headache A cluster headache is a type of headache that causes deep, intense head pain. Cluster headaches can last from 15 minutes to 3 hours. They usually occur:  On one side of the head. They may occur on the other side when a new cluster of headaches begins.  Repeatedly over weeks to months.  Several times a day.  At the same time of day, often at night.  More often in the fall and springtime.  What are the causes? The cause of this condition is not known. What increases the risk? This condition is more likely to develop in:  Males.  People who drink alcohol.  People who smoke or use products that contain nicotine or tobacco.  People who take medicines that cause blood vessels to expand, such as nitroglycerin.  People who take antihistamines.  What are the signs or symptoms? Symptoms of this condition include:  Severe pain on one side of the head that begins behind or around your eye or temple.  Pain on one side of the head.  Nausea.  Sensitivity to light.  Runny nose and nasal stuffiness.  Sweaty, pale skin on the face.  Droopy or swollen eyelid, eye redness, or tearing.  Restlessness and agitation.  How is this diagnosed? This condition may be diagnosed based on:  Your symptoms.  A physical exam.  Your health care provider may order tests to see if your headaches are caused by  another medical condition. These tests may show that you do not have cluster headaches. Tests may include:  A CT scan of your head.  An MRI of your head.  Lab tests.  How is this treated? This condition may be treated with:  Medicines to relieve pain and to prevent repeated (recurrent) attacks. Some people may need a combination of medicines.  Oxygen. This helps to relieve pain.  Follow these instructions at home: Headache diary Keep a headache diary as told by your health care provider. Doing this can help you and your health care provider figure out what triggers your headaches. In your headache diary, include information about:  The time of day that your headache started and what you were doing when it began.  How long your headache lasted.  Where your pain started and whether it moved to other areas.  The type of pain, such as burning, stabbing, throbbing, or cramping.  Your level of pain. Use a pain scale and rate the pain with a number from 1 (mild) up to 10 (severe).  The treatment that you used, and any change in symptoms after treatment.  Medicines  Take over-the-counter and prescription medicines only as told by your health care provider.  Do not drive or use heavy machinery while taking prescription pain medicine.  Use oxygen as told by your health care provider. Lifestyle  Follow a regular sleep schedule. Do not vary the time that you go to bed or the amount that you sleep from day to day. It is important to stay on the same schedule during a cluster period to help prevent headaches.  Exercise regularly.  Eat a healthy diet and avoid foods that may trigger your headaches.  Avoid alcohol.  Do not use any products that contain nicotine or tobacco, such as cigarettes and e-cigarettes. If you need help quitting, ask your health care provider. Contact a health care provider if:  Your headaches change, become more severe, or occur more often.  The medicine  or oxygen that your health care provider recommended does not help. Get help right away if:  You faint.  You have weakness or numbness, especially on one side of your body or face.  You have double vision.  You have nausea or vomiting that does not go away within several hours.  You have trouble talking, walking, or keeping your balance.  You have pain or stiffness in your neck.  You have a fever. Summary  A cluster headache is a type of headache that causes deep, intense head pain, usually on one side of the head.  Keep a headache diary to help discover what triggers your headaches.  A regular sleep schedule can help prevent headaches. This information is not intended to replace advice given to you by your health care provider. Make sure you discuss any questions you have with your health care provider. Document  Released: 09/16/2005 Document Revised: 05/28/2016 Document Reviewed: 05/28/2016 Elsevier Interactive Patient Education  [employer].

## 2017-05-20 LAB — URINE CULTURE
MICRO NUMBER:: 80902177
RESULT: NO GROWTH
SPECIMEN QUALITY: ADEQUATE

## 2017-05-20 LAB — BASIC METABOLIC PANEL WITH GFR
BUN: 17 mg/dL (ref 7–25)
CHLORIDE: 104 mmol/L (ref 98–110)
CO2: 29 mmol/L (ref 20–32)
CREATININE: 1.21 mg/dL (ref 0.70–1.25)
Calcium: 9.6 mg/dL (ref 8.6–10.3)
GFR, EST AFRICAN AMERICAN: 74 mL/min/{1.73_m2} (ref >=60)
GFR, Est Non African American: 64 mL/min/{1.73_m2} (ref >=60)
GLUCOSE: 99 mg/dL (ref 65–99)
Potassium: 4.4 mmol/L (ref 3.5–5.3)
Sodium: 140 mmol/L (ref 135–146)

## 2017-05-20 LAB — URINALYSIS, ROUTINE W REFLEX MICROSCOPIC
BILIRUBIN URINE: NEGATIVE
Bacteria, UA: NONE SEEN /HPF
Glucose, UA: NEGATIVE
Hgb urine dipstick: NEGATIVE
Hyaline Cast: NONE SEEN /LPF
Ketones, ur: NEGATIVE
NITRITE: NEGATIVE
Protein, ur: NEGATIVE
RBC / HPF: NONE SEEN /HPF (ref 0–2)
SPECIFIC GRAVITY, URINE: 1.011 (ref 1.001–1.03)
SQUAMOUS EPITHELIAL / LPF: NONE SEEN /HPF (ref <=5)
pH: 6 (ref 5.0–8.0)

## 2017-05-20 LAB — HEPATIC FUNCTION PANEL
AG RATIO: 2 (calc) (ref 1.0–2.5)
ALBUMIN MSPROF: 4.6 g/dL (ref 3.6–5.1)
ALT: 27 U/L (ref 9–46)
AST: 20 U/L (ref 10–35)
Alkaline phosphatase (APISO): 72 U/L (ref 40–115)
BILIRUBIN DIRECT: 0.1 mg/dL (ref 0.0–0.2)
GLOBULIN: 2.3 g/dL (ref 1.9–3.7)
Indirect Bilirubin: 0.6 mg/dL (calc) (ref 0.2–1.2)
TOTAL PROTEIN: 6.9 g/dL (ref 6.1–8.1)
Total Bilirubin: 0.7 mg/dL (ref 0.2–1.2)

## 2017-05-20 LAB — CBC WITH DIFFERENTIAL/PLATELET
BASOS ABS: 41 {cells}/uL (ref 0–200)
Basophils Relative: 0.7 %
EOS ABS: 100 {cells}/uL (ref 15–500)
EOS PCT: 1.7 %
HCT: 44.2 % (ref 38.5–50.0)
HEMOGLOBIN: 15.4 g/dL (ref 13.2–17.1)
Lymphs Abs: 1623 cells/uL (ref 850–3900)
MCH: 33.5 pg — AB (ref 27.0–33.0)
MCHC: 34.8 g/dL (ref 32.0–36.0)
MCV: 96.1 fL (ref 80.0–100.0)
MONOS PCT: 7.3 %
MPV: 11.1 fL (ref 7.5–12.5)
NEUTROS ABS: 3705 {cells}/uL (ref 1500–7800)
Neutrophils Relative %: 62.8 %
PLATELETS: 165 10*3/uL (ref 140–400)
RBC: 4.6 10*6/uL (ref 4.20–5.80)
RDW: 12.8 % (ref 11.0–15.0)
TOTAL LYMPHOCYTE: 27.5 %
WBC mixed population: 431 cells/uL (ref 200–950)
WBC: 5.9 10*3/uL (ref 3.8–10.8)

## 2017-05-20 LAB — LIPID PANEL
CHOLESTEROL: 216 mg/dL — AB (ref <200)
HDL: 35 mg/dL — ABNORMAL LOW (ref >40)
LDL CHOLESTEROL (CALC): 139 mg/dL — AB
Non-HDL Cholesterol (Calc): 181 mg/dL (calc) — ABNORMAL HIGH (ref <130)
TRIGLYCERIDES: 272 mg/dL — AB (ref <150)
Total CHOL/HDL Ratio: 6.2 (calc) — ABNORMAL HIGH (ref <5.0)

## 2017-05-20 LAB — MAGNESIUM: Magnesium: 2.2 mg/dL (ref 1.5–2.5)

## 2017-05-20 LAB — MICROALBUMIN / CREATININE URINE RATIO
CREATININE, URINE: 76 mg/dL (ref 20–370)
Microalb Creat Ratio: 3 mcg/mg creat (ref <30)
Microalb, Ur: 0.2 mg/dL

## 2017-05-20 LAB — PSA: PSA: 1.1 ng/mL (ref <=4.0)

## 2017-05-20 LAB — TSH: TSH: 1.97 m[IU]/L (ref 0.40–4.50)

## 2017-05-20 LAB — TESTOSTERONE: TESTOSTERONE: 344 ng/dL (ref 250–827)

## 2017-05-20 LAB — VITAMIN D 25 HYDROXY (VIT D DEFICIENCY, FRACTURES): Vit D, 25-Hydroxy: 27 ng/mL — ABNORMAL LOW (ref 30–100)

## 2017-06-26 ENCOUNTER — Telehealth: Payer: Self-pay | Admitting: Internal Medicine

## 2017-06-26 DIAGNOSIS — H9201 Otalgia, right ear: Secondary | ICD-10-CM

## 2017-06-26 DIAGNOSIS — H9209 Otalgia, unspecified ear: Secondary | ICD-10-CM | POA: Insufficient documentation

## 2017-06-26 NOTE — Telephone Encounter (Signed)
Patient called w/ c/o continued right ear pain/pressure/ tone in back ground. rt ear  no improvement, requesting referral to ENT.

## 2017-06-26 NOTE — Addendum Note (Signed)
Addended by: Vicie Mutters R on: 06/26/2017 03:13 PM   Modules accepted: Orders

## 2017-07-10 DIAGNOSIS — H6993 Unspecified Eustachian tube disorder, bilateral: Secondary | ICD-10-CM

## 2017-07-10 DIAGNOSIS — H9313 Tinnitus, bilateral: Secondary | ICD-10-CM | POA: Insufficient documentation

## 2017-07-10 HISTORY — DX: Unspecified eustachian tube disorder, bilateral: H69.93

## 2017-08-04 ENCOUNTER — Other Ambulatory Visit: Payer: Self-pay | Admitting: Physician Assistant

## 2017-09-05 ENCOUNTER — Encounter: Payer: Self-pay | Admitting: Nurse Practitioner

## 2017-09-05 ENCOUNTER — Ambulatory Visit (INDEPENDENT_AMBULATORY_CARE_PROVIDER_SITE_OTHER): Payer: BLUE CROSS/BLUE SHIELD | Admitting: Nurse Practitioner

## 2017-09-05 VITALS — BP 136/72 | HR 78 | Ht 71.0 in | Wt 212.0 lb

## 2017-09-05 DIAGNOSIS — K644 Residual hemorrhoidal skin tags: Secondary | ICD-10-CM | POA: Diagnosis not present

## 2017-09-05 NOTE — Patient Instructions (Signed)
If you are age 61 or older, your body mass index should be between 23-30. Your Body mass index is 29.57 kg/m. If this is out of the aforementioned range listed, please consider follow up with your Primary Care Provider.  If you are age 62 or younger, your body mass index should be between 19-25. Your Body mass index is 29.57 kg/m. If this is out of the aformentioned range listed, please consider follow up with your Primary Care Provider.   You have been referred to [facility] for thrombosed external hemorrhoid.  Thank you for choosing me and [facility].   Tye Savoy, NP

## 2017-09-05 NOTE — Progress Notes (Addendum)
     Chief Complaint:  Hemorrhoidal pain   HPI: Patient is a 61 year old male known previously to Dr. Deatra Ina, now followed by Dr. Ardis Hughs. He's had problems with anal fissures and hemorrhoids and underwent hemorrhoidal banding in 2016 and June 2017. He has done relatively well since last hemorrhoidal banding but now comes in with a two week hx of rectal pain, minor bleeding and swollen hemorrhoid for which he has been using Prep H. He has not been constipated, does not strain but does tend to sit on the toilet reading for extended amounts of time. He has been using Preparation H sinc He has no abdominal pain or other gastrointestinal complaints   Past Medical History:  Diagnosis Date  . Colon polyp 04-28-2002   Colonoscopy   . HSV-2 infection   . Hyperlipemia   . Hypertension   . Insomnia   . Interstitial cystitis 03/05/2016  . Prolapsed internal hemorrhoids, grade 2 08/04/2012   Grade 2 hemorrhoids  05/11/15 band ligation right posterior hemorrhoidal bundle 06/14/15 band ligation right anterior hemorrhoidal bundle 07/13/15 LL banded   . Prostatitis     Patient's surgical history, family medical history, social history, medications and allergies were all reviewed in Epic    Physical Exam: BP 136/72   Pulse 78   Ht 5\' 11"  (1.803 m)   Wt 212 lb (96.2 kg)   BMI 29.57 kg/m   GENERAL:  Well-developed white male in NAD PSYCH: :Pleasant, cooperative, normal affect PULM: Normal respiratory effort Musculoskeletal:  Normal muscle tone, normal strength NEURO: Alert and oriented x 3, no focal neurologic deficits Rectal:  Left thrombosed external hemorrhoid. No fissures appreciated. Soft stool in vault   ASSESSMENT and PLAN:  Pleasant 61 year old male with thrombosed left external hemorrhoid with associated pain and minor bleeding.  He has been using Prep H for two weeks.   -referral to [facility]. We will call to see if patient can be worked in today. Hopefully hemorrhoid can be lanced today and  give him immediate relief. Patient has undergone banding of internal hemorrhoids in 2016 and 2017. At some point he may benefit from a hemorrhoidectomy.    Tye Savoy , NP 09/05/2017, 8:50 AM  Addendum: Reviewed and agree with initial management. Pyrtle, Lajuan Lines, MD

## 2017-09-30 ENCOUNTER — Other Ambulatory Visit: Payer: Self-pay | Admitting: Physician Assistant

## 2017-11-21 ENCOUNTER — Other Ambulatory Visit: Payer: Self-pay | Admitting: Internal Medicine

## 2018-02-27 ENCOUNTER — Other Ambulatory Visit: Payer: Self-pay | Admitting: Physician Assistant

## 2018-03-19 ENCOUNTER — Encounter: Payer: Self-pay | Admitting: Physician Assistant

## 2018-03-31 ENCOUNTER — Encounter: Payer: Self-pay | Admitting: Internal Medicine

## 2018-03-31 ENCOUNTER — Ambulatory Visit (INDEPENDENT_AMBULATORY_CARE_PROVIDER_SITE_OTHER): Payer: BLUE CROSS/BLUE SHIELD | Admitting: Internal Medicine

## 2018-03-31 VITALS — BP 116/76 | HR 80 | Temp 97.3°F | Resp 18 | Ht 71.0 in | Wt 217.4 lb

## 2018-03-31 DIAGNOSIS — Z0001 Encounter for general adult medical examination with abnormal findings: Secondary | ICD-10-CM

## 2018-03-31 DIAGNOSIS — R350 Frequency of micturition: Secondary | ICD-10-CM | POA: Diagnosis not present

## 2018-03-31 DIAGNOSIS — Z13220 Encounter for screening for lipoid disorders: Secondary | ICD-10-CM | POA: Diagnosis not present

## 2018-03-31 DIAGNOSIS — Z0000 Encounter for general adult medical examination without abnormal findings: Secondary | ICD-10-CM

## 2018-03-31 DIAGNOSIS — N401 Enlarged prostate with lower urinary tract symptoms: Secondary | ICD-10-CM

## 2018-03-31 DIAGNOSIS — Z111 Encounter for screening for respiratory tuberculosis: Secondary | ICD-10-CM

## 2018-03-31 DIAGNOSIS — Z79899 Other long term (current) drug therapy: Secondary | ICD-10-CM

## 2018-03-31 DIAGNOSIS — Z136 Encounter for screening for cardiovascular disorders: Secondary | ICD-10-CM | POA: Diagnosis not present

## 2018-03-31 DIAGNOSIS — Z125 Encounter for screening for malignant neoplasm of prostate: Secondary | ICD-10-CM | POA: Diagnosis not present

## 2018-03-31 DIAGNOSIS — Z131 Encounter for screening for diabetes mellitus: Secondary | ICD-10-CM | POA: Diagnosis not present

## 2018-03-31 DIAGNOSIS — Z1211 Encounter for screening for malignant neoplasm of colon: Secondary | ICD-10-CM

## 2018-03-31 DIAGNOSIS — E782 Mixed hyperlipidemia: Secondary | ICD-10-CM

## 2018-03-31 DIAGNOSIS — R0989 Other specified symptoms and signs involving the circulatory and respiratory systems: Secondary | ICD-10-CM

## 2018-03-31 DIAGNOSIS — B356 Tinea cruris: Secondary | ICD-10-CM

## 2018-03-31 DIAGNOSIS — E559 Vitamin D deficiency, unspecified: Secondary | ICD-10-CM | POA: Diagnosis not present

## 2018-03-31 DIAGNOSIS — N138 Other obstructive and reflux uropathy: Secondary | ICD-10-CM

## 2018-03-31 DIAGNOSIS — Z130 Encounter for screening for diseases of the blood and blood-forming organs and certain disorders involving the immune mechanism: Secondary | ICD-10-CM

## 2018-03-31 DIAGNOSIS — Z1212 Encounter for screening for malignant neoplasm of rectum: Secondary | ICD-10-CM

## 2018-03-31 DIAGNOSIS — Z1329 Encounter for screening for other suspected endocrine disorder: Secondary | ICD-10-CM | POA: Diagnosis not present

## 2018-03-31 DIAGNOSIS — F17200 Nicotine dependence, unspecified, uncomplicated: Secondary | ICD-10-CM

## 2018-03-31 DIAGNOSIS — I10 Essential (primary) hypertension: Secondary | ICD-10-CM | POA: Diagnosis not present

## 2018-03-31 DIAGNOSIS — Z1389 Encounter for screening for other disorder: Secondary | ICD-10-CM | POA: Diagnosis not present

## 2018-03-31 DIAGNOSIS — R7309 Other abnormal glucose: Secondary | ICD-10-CM

## 2018-03-31 MED ORDER — FLUCONAZOLE 150 MG PO TABS: ORAL_TABLET | ORAL | 1 refills | Status: DC

## 2018-03-31 NOTE — Progress Notes (Signed)
[facility]   Unk Pinto, M.D.     Uvaldo Bristle. Silverio Lay, P.A.-C Liane Comber, [place]                [address] [place], [place] [address] Telephone [phone] Telefax [phone] Annual  Screening/Preventative Visit  & Comprehensive Evaluation & Examination     This very nice 62 y.o. MWM presents for a Screening /Preventative Visit & comprehensive evaluation and management of multiple medical co-morbidities.  Patient has been followed for labile HTN, HLD, Prediabetes and Vitamin D Deficiency. Patient is on Uroxatral for Prostatism.     Patient is followed expectantly for labile HTN  With an elevated  BP 144/90 in 2003 & normal since. Patient's BP has been controlled and today's BP is at goal - 116/76. Patient denies any cardiac symptoms as chest pain, palpitations, shortness of breath, dizziness or ankle swelling.     Patient's hyperlipidemia is not controlled with diet as he has been off of his Pravastatin x 1 month for failure to refill his Rx.  Patient denies myalgias or other medication SE's. Current  lipids off of meds are not at goal: Lab Results  Component Value Date   CHOL 228 (H) 03/31/2018   HDL 42 03/31/2018   LDLCALC 147 (H) 03/31/2018   TRIG 256 (H) 03/31/2018   CHOLHDL 5.4 (H) 03/31/2018      Patient is monitored expectantly for PreDiabetes and patient denies reactive hypoglycemic symptoms, visual blurring, diabetic polys or paresthesias. Last A1c is at goal: Lab Results  Component Value Date   HGBA1C 5.2 03/31/2018       Finally, patient has history of Vitamin D Deficiency and current vitamin D is not at goal: Lab Results  Component Value Date   VD25OH 36 03/31/2018   Current Outpatient Medications on File Prior to Visit  Medication Sig  . acyclovir (ZOVIRAX) 400 MG tablet TAKE 1 TABLET TWICE A DAY  . alfuzosin (UROXATRAL) 10 MG 24 hr tablet TAKE 1 TABLET DAILY  WITH BREAKFAST  . Multiple Vitamin (MULTIVITAMIN) tablet Take 1 tablet by mouth daily.  Marland Kitchen nystatin (NYSTATIN) powder APPLY DAILY AFTER SHOWER AS NEEDED  . Omega-3 Fatty Acids (FISH OIL PO) Take 1 capsule by mouth daily.   No current facility-administered medications on file prior to visit.    Allergies  Allergen Reactions  . Sulfa Antibiotics     dizzy  . Sulfur Other (See Comments)    disoriented   Past Medical History:  Diagnosis Date  . Colon polyp 04-28-2002   Colonoscopy   . HSV-2 infection   . Hyperlipemia   . Hypertension   . Insomnia   . Interstitial cystitis 03/05/2016  . Prolapsed internal hemorrhoids, grade 2 08/04/2012   Grade 2 hemorrhoids  05/11/15 band ligation right posterior hemorrhoidal bundle 06/14/15 band ligation right anterior hemorrhoidal bundle 07/13/15 LL banded   . Prostatitis    Health Maintenance  Topic Date Due  . INFLUENZA VACCINE  04/30/2018  . COLONOSCOPY  08/04/2022  . TETANUS/TDAP  10/02/2027  . Hepatitis C Screening  Completed  . HIV Screening  Completed   Immunization History  Administered Date(s) Administered  . PPD Test 03/31/2018  . Td 01/22/2007  . Tdap 10/01/2017  . Zoster Recombinat (Shingrix) 10/01/2017, 11/01/2017   Last Colon - 08/04/2012 - Dr Deatra Ina - f/u due Nov 2023  Past  Surgical History:  Procedure Laterality Date  . CALCANEAL OSTEOTOMY W/ INTERNAL FIXATION    . COLONOSCOPY  08/04/2012   Procedure: COLONOSCOPY;  Surgeon: Inda Castle, MD;  Location: [facility];  Service: Endoscopy;  Laterality: N/A;  with possible banding  . HEMORRHOID BANDING    . HEMORRHOID BANDING  2016   Family History  Problem Relation Age of Onset  . Cancer Mother   . Colon cancer Maternal Grandmother        dx in her late 69's   Socioeconomic History  . Marital status: Married    Spouse name: Not on file  . Number of children: 2  Occupational History  . Occupation: Insurance underwriter  Tobacco Use  . Smoking status: Light Tobacco Smoker     Types: Cigars  . Smokeless tobacco: Never Used  . Tobacco comment: Tobacco info given 04/17/15  Substance and Sexual Activity  . Alcohol use: Yes    Alcohol/week: 1.2 oz    Types: 2 Glasses of wine per week  . Drug use: No  . Sexual activity: Active    ROS Constitutional: Denies fever, chills, weight loss/gain, headaches, insomnia,  night sweats or change in appetite. Does c/o fatigue. Eyes: Denies redness, blurred vision, diplopia, discharge, itchy or watery eyes.  ENT: Denies discharge, congestion, post nasal drip, epistaxis, sore throat, earache, hearing loss, dental pain, Tinnitus, Vertigo, Sinus pain or snoring.  Cardio: Denies chest pain, palpitations, irregular heartbeat, syncope, dyspnea, diaphoresis, orthopnea, PND, claudication or edema Respiratory: denies cough, dyspnea, DOE, pleurisy, hoarseness, laryngitis or wheezing.  Gastrointestinal: Denies dysphagia, heartburn, reflux, water brash, pain, cramps, nausea, vomiting, bloating, diarrhea, constipation, hematemesis, melena, hematochezia, jaundice or hemorrhoids Genitourinary: Denies dysuria, frequency, urgency, nocturia, hesitancy, discharge, hematuria or flank pain Musculoskeletal: Denies arthralgia, myalgia, stiffness, Jt. Swelling, pain, limp or strain/sprain. Denies Falls. Skin: Denies puritis, rash, hives, warts, acne, eczema or change in skin lesion Neuro: No weakness, tremor, incoordination, spasms, paresthesia or pain Psychiatric: Denies confusion, memory loss or sensory loss. Denies Depression. Endocrine: Denies change in weight, skin, hair change, nocturia, and paresthesia, diabetic polys, visual blurring or hyper / hypo glycemic episodes.  Heme/Lymph: No excessive bleeding, bruising or enlarged lymph nodes.  Physical Exam  BP 116/76   Pulse 80   Temp (!) 97.3 F (36.3 C)   Resp 18   Ht 5\' 11"  (1.803 m)   Wt 217 lb 6.4 oz (98.6 kg)   BMI 30.32 kg/m   General Appearance: Well nourished and well groomed and in  no apparent distress.  Eyes: PERRLA, EOMs, conjunctiva no swelling or erythema, normal fundi and vessels. Sinuses: No frontal/maxillary tenderness ENT/Mouth: EACs patent / TMs  nl. Nares clear without erythema, swelling, mucoid exudates. Oral hygiene is good. No erythema, swelling, or exudate. Tongue normal, non-obstructing. Tonsils not swollen or erythematous. Hearing normal.  Neck: Supple, thyroid not palpable. No bruits, nodes or JVD. Respiratory: Respiratory effort normal.  BS equal and clear bilateral without rales, rhonci, wheezing or stridor. Cardio: Heart sounds are normal with regular rate and rhythm and no murmurs, rubs or gallops. Peripheral pulses are normal and equal bilaterally without edema. No aortic or femoral bruits. Chest: symmetric with normal excursions and percussion.  Abdomen: Soft, with Nl bowel sounds. Nontender, no guarding, rebound, hernias, masses, or organomegaly.  Lymphatics: Non tender without lymphadenopathy.  Genitourinary: No hernias.Testes nl. DRE - prostate nl for age - smooth & firm w/o nodules.  Has Tinea Cruris. Musculoskeletal: Full ROM all peripheral extremities, joint stability, 5/5 strength, and  normal gait. Skin: Warm and dry without rashes, lesions, cyanosis, clubbing or  ecchymosis.  Neuro: Cranial nerves intact, reflexes equal bilaterally. Normal muscle tone, no cerebellar symptoms. Sensation intact.  Pysch: Alert and oriented X 3 with normal affect, insight and judgment appropriate.   Assessment and Plan  1. Annual Preventative/Screening Exam   2. Labile hypertension  - EKG 12-Lead - [place], RETROPERITNL ABD,  LTD - Urinalysis, Routine w reflex microscopic - Microalbumin / creatinine urine ratio - CBC with Differential/Platelet - COMPLETE METABOLIC PANEL WITH GFR - Magnesium - TSH  3. Hyperlipidemia, mixed  - EKG 12-Lead - [place], RETROPERITNL ABD,  LTD - Lipid panel - TSH  4. Abnormal glucose  - EKG 12-Lead - [place], RETROPERITNL ABD,   LTD - Hemoglobin A1c  5. Vitamin D deficiency  - VITAMIN D 25 Hydroxyl  6. Screening for colorectal cancer  - POC Hemoccult Bld/Stl  7. BPH with obstruction/lower urinary tract symptoms  - PSA  8. Prostate cancer screening  - PSA  9. Screening for ischemic heart disease  - EKG 12-Lead  10. Smoker  - EKG 12-Lead - [place], RETROPERITNL ABD,  LTD  11. Screening for AAA (aortic abdominal aneurysm)  - [place], RETROPERITNL ABD,  LTD - Iron,Total/Total Iron Binding Cap - Vitamin B12 - Testosterone - CBC with Differential/Platelet  12. Medication management  - Urinalysis, Routine w reflex microscopic - Microalbumin / creatinine urine ratio - CBC with Differential/Platelet - COMPLETE METABOLIC PANEL WITH GFR - Magnesium - Lipid panel - TSH - Hemoglobin A1c - Insulin, random - VITAMIN D 25 Hydroxyl  13. Screening examination for pulmonary tuberculosis  - PPD            Given Rx Diflucan 100 mg # 14 - qd x 1rf. Patient was counseled in prudent diet, weight control to achieve/maintain BMI less than 25, BP monitoring, regular exercise and medications as discussed.  Discussed med effects and SE's. Routine screening labs and tests as requested with regular follow-up as recommended. Over 40 minutes of exam, counseling, chart review and high complex critical decision making was performed

## 2018-03-31 NOTE — Patient Instructions (Signed)

## 2018-04-01 ENCOUNTER — Other Ambulatory Visit: Payer: Self-pay | Admitting: Internal Medicine

## 2018-04-01 DIAGNOSIS — E782 Mixed hyperlipidemia: Secondary | ICD-10-CM

## 2018-04-01 LAB — URINALYSIS, ROUTINE W REFLEX MICROSCOPIC
Bacteria, UA: NONE SEEN /HPF
Bilirubin Urine: NEGATIVE
GLUCOSE, UA: NEGATIVE
Hgb urine dipstick: NEGATIVE
Hyaline Cast: NONE SEEN /LPF
Ketones, ur: NEGATIVE
Nitrite: NEGATIVE
PROTEIN: NEGATIVE
RBC / HPF: NONE SEEN /HPF (ref 0–2)
SPECIFIC GRAVITY, URINE: 1.014 (ref 1.001–1.03)
Squamous Epithelial / LPF: NONE SEEN /HPF (ref <=5)
pH: 5.5 (ref 5.0–8.0)

## 2018-04-01 LAB — TESTOSTERONE: Testosterone: 226 ng/dL — ABNORMAL LOW (ref 250–827)

## 2018-04-01 LAB — VITAMIN D 25 HYDROXY (VIT D DEFICIENCY, FRACTURES): Vit D, 25-Hydroxy: 36 ng/mL (ref 30–100)

## 2018-04-01 LAB — COMPLETE METABOLIC PANEL WITH GFR
AG RATIO: 1.8 (calc) (ref 1.0–2.5)
ALT: 29 U/L (ref 9–46)
AST: 21 U/L (ref 10–35)
Albumin: 4.3 g/dL (ref 3.6–5.1)
Alkaline phosphatase (APISO): 81 U/L (ref 40–115)
BUN: 16 mg/dL (ref 7–25)
CALCIUM: 9.4 mg/dL (ref 8.6–10.3)
CO2: 26 mmol/L (ref 20–32)
Chloride: 104 mmol/L (ref 98–110)
Creat: 1.02 mg/dL (ref 0.70–1.25)
GFR, EST AFRICAN AMERICAN: 91 mL/min/{1.73_m2} (ref >=60)
GFR, EST NON AFRICAN AMERICAN: 78 mL/min/{1.73_m2} (ref >=60)
GLOBULIN: 2.4 g/dL (ref 1.9–3.7)
Glucose, Bld: 144 mg/dL — ABNORMAL HIGH (ref 65–99)
POTASSIUM: 4 mmol/L (ref 3.5–5.3)
SODIUM: 138 mmol/L (ref 135–146)
Total Bilirubin: 0.7 mg/dL (ref 0.2–1.2)
Total Protein: 6.7 g/dL (ref 6.1–8.1)

## 2018-04-01 LAB — CBC WITH DIFFERENTIAL/PLATELET
BASOS ABS: 43 {cells}/uL (ref 0–200)
Basophils Relative: 0.7 %
EOS ABS: 92 {cells}/uL (ref 15–500)
EOS PCT: 1.5 %
HEMATOCRIT: 40.6 % (ref 38.5–50.0)
Hemoglobin: 14.6 g/dL (ref 13.2–17.1)
Lymphs Abs: 1580 cells/uL (ref 850–3900)
MCH: 33.6 pg — AB (ref 27.0–33.0)
MCHC: 36 g/dL (ref 32.0–36.0)
MCV: 93.3 fL (ref 80.0–100.0)
MPV: 11.7 fL (ref 7.5–12.5)
Monocytes Relative: 7.9 %
NEUTROS ABS: 3904 {cells}/uL (ref 1500–7800)
Neutrophils Relative %: 64 %
Platelets: 156 10*3/uL (ref 140–400)
RBC: 4.35 10*6/uL (ref 4.20–5.80)
RDW: 13 % (ref 11.0–15.0)
Total Lymphocyte: 25.9 %
WBC: 6.1 10*3/uL (ref 3.8–10.8)
WBCMIX: 482 {cells}/uL (ref 200–950)

## 2018-04-01 LAB — LIPID PANEL
CHOL/HDL RATIO: 5.4 (calc) — AB (ref <5.0)
Cholesterol: 228 mg/dL — ABNORMAL HIGH (ref <200)
HDL: 42 mg/dL (ref >40)
LDL Cholesterol (Calc): 147 mg/dL (calc) — ABNORMAL HIGH
Non-HDL Cholesterol (Calc): 186 mg/dL (calc) — ABNORMAL HIGH (ref <130)
Triglycerides: 256 mg/dL — ABNORMAL HIGH (ref <150)

## 2018-04-01 LAB — MICROALBUMIN / CREATININE URINE RATIO
Creatinine, Urine: 87 mg/dL (ref 20–320)
Microalb Creat Ratio: 3 mcg/mg creat (ref <30)
Microalb, Ur: 0.3 mg/dL

## 2018-04-01 LAB — HEMOGLOBIN A1C
EAG (MMOL/L): 5.7 (calc)
Hgb A1c MFr Bld: 5.2 % of total Hgb (ref <5.7)
MEAN PLASMA GLUCOSE: 103 (calc)

## 2018-04-01 LAB — VITAMIN B12: Vitamin B-12: 662 pg/mL (ref 200–1100)

## 2018-04-01 LAB — PSA: PSA: 1.2 ng/mL (ref <=4.0)

## 2018-04-01 LAB — IRON, TOTAL/TOTAL IRON BINDING CAP
%SAT: 17 % (calc) — ABNORMAL LOW (ref 20–48)
IRON: 49 ug/dL — AB (ref 50–180)
TIBC: 293 ug/dL (ref 250–425)

## 2018-04-01 LAB — MAGNESIUM: Magnesium: 1.9 mg/dL (ref 1.5–2.5)

## 2018-04-01 LAB — INSULIN, RANDOM: Insulin: 84.1 u[IU]/mL — ABNORMAL HIGH (ref 2.0–19.6)

## 2018-04-01 LAB — TSH: TSH: 1.68 m[IU]/L (ref 0.40–4.50)

## 2018-04-01 MED ORDER — ROSUVASTATIN CALCIUM 40 MG PO TABS: ORAL_TABLET | ORAL | 5 refills | Status: DC

## 2018-04-04 ENCOUNTER — Encounter: Payer: Self-pay | Admitting: Internal Medicine

## 2018-05-22 ENCOUNTER — Other Ambulatory Visit: Payer: Self-pay | Admitting: Physician Assistant

## 2018-05-26 ENCOUNTER — Encounter: Payer: Self-pay | Admitting: Physician Assistant

## 2018-06-22 ENCOUNTER — Other Ambulatory Visit: Payer: Self-pay | Admitting: Physician Assistant

## 2018-07-10 ENCOUNTER — Ambulatory Visit: Payer: Self-pay | Admitting: Adult Health

## 2018-09-03 ENCOUNTER — Ambulatory Visit (INDEPENDENT_AMBULATORY_CARE_PROVIDER_SITE_OTHER): Payer: BLUE CROSS/BLUE SHIELD | Admitting: Internal Medicine

## 2018-09-03 VITALS — BP 122/72 | HR 56 | Temp 97.5°F | Resp 16 | Ht 71.0 in | Wt 212.4 lb

## 2018-09-03 DIAGNOSIS — L723 Sebaceous cyst: Secondary | ICD-10-CM

## 2018-09-03 DIAGNOSIS — R0989 Other specified symptoms and signs involving the circulatory and respiratory systems: Secondary | ICD-10-CM | POA: Diagnosis not present

## 2018-09-03 DIAGNOSIS — L570 Actinic keratosis: Secondary | ICD-10-CM | POA: Diagnosis not present

## 2018-09-03 DIAGNOSIS — B078 Other viral warts: Secondary | ICD-10-CM | POA: Diagnosis not present

## 2018-09-03 DIAGNOSIS — B079 Viral wart, unspecified: Secondary | ICD-10-CM

## 2018-09-03 NOTE — Progress Notes (Signed)
  Subjective:    Patient ID: Jeremy Fowler, male    DOB: 05-25-1956, 63 y.o.   MRN: [id number]  HPI     This nice 62 yo MWM with hx/o labile HTN presents for recheck w/o complaints of HA's , dizziness, CP, palpitations, dyspnea or edema.     Patient presents with concerns re: a non-healing recurrent ulceration of skin of the dorsal Lt hand. He also has an apparent wart of the dorsal Lt hand. Lastly he has a tender sebaceous cyst if his mid forehead.   Medication Sig  . acyclovir (ZOVIRAX) 400 MG tablet TAKE 1 TABLET TWICE A DAY  . alfuzosin (UROXATRAL) 10 MG 24 hr tablet TAKE 1 TABLET DAILY WITH BREAKFAST  . fluconazole (DIFLUCAN) 150 MG tablet Take 1 tablet 2 x/week  if needed for yeast / fungus  infection  . Multiple Vitamin (MULTIVITAMIN) tablet Take 1 tablet by mouth daily.  Marland Kitchen nystatin (NYSTATIN) powder APPLY DAILY AFTER SHOWER AS NEEDED  . Omega-3 Fatty Acids (FISH OIL PO) Take 1 capsule by mouth daily.  . rosuvastatin (CRESTOR) 40 MG tablet Take 1/2 to 1 tablet daily or as directed for Cholesterol   No facility-administered medications prior to visit.    Allergies  Allergen Reactions  . Sulfa Antibiotics     dizzy  . Sulfur Other (See Comments)    disoriented   Past Medical History:  Diagnosis Date  . Colon polyp 04-28-2002   Colonoscopy   . HSV-2 infection   . Hyperlipemia   . Hypertension   . Insomnia   . Interstitial cystitis 03/05/2016  . Prolapsed internal hemorrhoids, grade 2 08/04/2012   Grade 2 hemorrhoids  05/11/15 band ligation right posterior hemorrhoidal bundle 06/14/15 band ligation right anterior hemorrhoidal bundle 07/13/15 LL banded   . Prostatitis    Review of Systems    10 point systems review negative except as above.    Objective:   Physical Exam  BP 122/72   Pulse (!) 56   Temp (!) 97.5 F (36.4 C)   Resp 16   Ht 5\' 11"  (1.803 m)   Wt 212 lb 6.4 oz (96.3 kg)   BMI 29.62 kg/m   HEENT - WNL. Neck - supple.  Chest - Clear equal BS. Cor - Nl HS.  RRR w/o sig MGR. PP 1(+). No edema. MS- FROM w/o deformities.  Gait Nl. Neuro -  Nl w/o focal abnormalities. Skin - (1) there is a 15 mm x 12 mm erythematous scaly superficially ulcerated area of the dorsal Lt hand over the groove betw the knuckles of the middle & ring fingers. (2) there is a 4 mm x 54mm  filamentous wart of the Lt index knuckle. (3) there is a 5 mm x 5 mm  sebaceous cyst if the middle mid forehead.     After informed consent and aseptic prep with alcohol  Lesion (1) was treated with liquid Nitrogen by triple freeze/thaw technique (CPT 17000). Then Lesion (2) was treated in similar fashion (CPT 17003). Lastly Lesion (3) was sharply I&D'd with a #11 scalpel and the cyst capsule was sharply excised and dissected free. (CPT 10060). Sterile bandage was applied & patient was instructed in po wound care.     Assessment & Plan:   1. Labile hypertension  2. Actinic keratosis (CPT 17000)   3. Other viral warts (CPT 17003)   4. Sebaceous cyst (CPT 10060)  .

## 2018-09-04 ENCOUNTER — Encounter: Payer: Self-pay | Admitting: Internal Medicine

## 2018-10-16 ENCOUNTER — Ambulatory Visit: Payer: Self-pay | Admitting: Internal Medicine

## 2019-01-11 ENCOUNTER — Other Ambulatory Visit: Payer: Self-pay | Admitting: Internal Medicine

## 2019-01-29 HISTORY — PX: TRICEPS TENDON REPAIR: SHX2577

## 2019-02-02 ENCOUNTER — Telehealth: Payer: BLUE CROSS/BLUE SHIELD | Admitting: Physician Assistant

## 2019-02-02 ENCOUNTER — Other Ambulatory Visit: Payer: Self-pay

## 2019-02-02 VITALS — Wt 209.0 lb

## 2019-02-02 DIAGNOSIS — S46312A Strain of muscle, fascia and tendon of triceps, left arm, initial encounter: Secondary | ICD-10-CM
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
## 2019-02-09 ENCOUNTER — Ambulatory Visit (INDEPENDENT_AMBULATORY_CARE_PROVIDER_SITE_OTHER): Payer: BLUE CROSS/BLUE SHIELD | Admitting: Orthopaedic Surgery

## 2019-02-09 ENCOUNTER — Ambulatory Visit: Payer: Self-pay

## 2019-02-09 ENCOUNTER — Encounter: Payer: Self-pay | Admitting: Orthopaedic Surgery

## 2019-02-09 ENCOUNTER — Other Ambulatory Visit: Payer: Self-pay

## 2019-02-09 VITALS — BP 159/87 | HR 67 | Ht 71.0 in | Wt 210.0 lb

## 2019-02-09 DIAGNOSIS — M79602 Pain in left arm: Secondary | ICD-10-CM | POA: Insufficient documentation

## 2019-02-09 HISTORY — DX: Pain in left arm: M79.602
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
## 2019-02-10 ENCOUNTER — Other Ambulatory Visit: Payer: Self-pay

## 2019-02-10 ENCOUNTER — Telehealth: Payer: Self-pay

## 2019-02-10 ENCOUNTER — Ambulatory Visit (INDEPENDENT_AMBULATORY_CARE_PROVIDER_SITE_OTHER): Payer: BLUE CROSS/BLUE SHIELD | Admitting: Orthopedic Surgery

## 2019-02-10 ENCOUNTER — Encounter: Payer: Self-pay | Admitting: Orthopedic Surgery

## 2019-02-10 DIAGNOSIS — M79602 Pain in left arm: Secondary | ICD-10-CM | POA: Diagnosis not present

## 2019-02-10 DIAGNOSIS — M25522 Pain in left elbow: Secondary | ICD-10-CM

## 2019-02-10 NOTE — Telephone Encounter (Signed)
Put order in for STAT MRI of elbow. Patient needs surgery. Dr Marlou Sa asked for this to be done by the weekend.

## 2019-02-11 ENCOUNTER — Encounter (HOSPITAL_COMMUNITY): Payer: Self-pay

## 2019-02-11 ENCOUNTER — Ambulatory Visit (HOSPITAL_COMMUNITY)
Admission: RE | Admit: 2019-02-11 | Discharge: 2019-02-11 | Disposition: A | Discharge status: Home / Self Care | Payer: BLUE CROSS/BLUE SHIELD | Source: Ambulatory Visit | Attending: Orthopedic Surgery | Admitting: Orthopedic Surgery

## 2019-02-11 ENCOUNTER — Telehealth: Payer: Self-pay

## 2019-02-11 ENCOUNTER — Encounter: Payer: Self-pay | Admitting: Orthopedic Surgery

## 2019-02-11 DIAGNOSIS — M25522 Pain in left elbow: Secondary | ICD-10-CM

## 2019-02-11 NOTE — Telephone Encounter (Signed)
FYI

## 2019-02-11 NOTE — Telephone Encounter (Signed)
Jeremy Fowler did [place]

## 2019-02-11 NOTE — Telephone Encounter (Signed)
Pt is scheduled with [facility] tomorrow 02/12/19, he was originally scheduled today with [facility] but is claustrophobic so had to change facilities to [facility].
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
## 2019-02-11 NOTE — Telephone Encounter (Signed)
Ok thx.

## 2019-02-11 NOTE — Telephone Encounter (Signed)
Patient wanted to make you aware that he has a scheduled MRI appt today.

## 2019-02-12 ENCOUNTER — Other Ambulatory Visit: Payer: Self-pay

## 2019-02-12 ENCOUNTER — Ambulatory Visit
Admission: RE | Admit: 2019-02-12 | Discharge: 2019-02-12 | Disposition: A | Discharge status: Home / Self Care | Payer: BLUE CROSS/BLUE SHIELD | Source: Ambulatory Visit | Attending: Orthopedic Surgery | Admitting: Orthopedic Surgery

## 2019-02-12 ENCOUNTER — Telehealth: Payer: Self-pay | Admitting: Orthopedic Surgery

## 2019-02-12 ENCOUNTER — Other Ambulatory Visit: Payer: Self-pay | Admitting: Orthopedic Surgery

## 2019-02-12 DIAGNOSIS — M25522 Pain in left elbow: Secondary | ICD-10-CM

## 2019-02-12 MED ORDER — DIAZEPAM 5 MG PO TABS: ORAL_TABLET | ORAL | 0 refills | Status: DC

## 2019-02-12 NOTE — Telephone Encounter (Signed)
Patient called advised the Rx is not at the pharmacy. Patient said he just left the pharmacy. Patient said he can go back to pick up the Rx. The number to contact patient is [phone]

## 2019-02-12 NOTE — Telephone Encounter (Signed)
Patient called advised he is having a MRI today at 4:00pm today. Patient asked if he can get something called in for him. Patient said he is claustrophobic. Patient uses the [facility] on [address]. The number to contact patient is [phone]

## 2019-02-12 NOTE — Telephone Encounter (Signed)
I called pharmacy and verified. They do have the prescription for patient. I called patient and advised.

## 2019-02-12 NOTE — Telephone Encounter (Signed)
Great thx

## 2019-02-12 NOTE — Telephone Encounter (Signed)
Called valium into patients pharmacy.  Called patient advised. Done.

## 2019-02-15 ENCOUNTER — Encounter: Payer: Self-pay | Admitting: Orthopedic Surgery

## 2019-02-15 ENCOUNTER — Ambulatory Visit (INDEPENDENT_AMBULATORY_CARE_PROVIDER_SITE_OTHER): Payer: BLUE CROSS/BLUE SHIELD | Admitting: Orthopedic Surgery

## 2019-02-15 ENCOUNTER — Other Ambulatory Visit: Payer: Self-pay

## 2019-02-15 DIAGNOSIS — S46312D Strain of muscle, fascia and tendon of triceps, left arm, subsequent encounter: Secondary | ICD-10-CM

## 2019-02-16 ENCOUNTER — Telehealth: Payer: Self-pay | Admitting: Orthopedic Surgery

## 2019-02-16 NOTE — Telephone Encounter (Signed)
Patient is scheduled for left elbow triceps repair this Thursday May 21st at [facility]. He would like to discuss what will be done (splint or sling) in regards to limiting his ROM.  He would also like to know what Rx he will be taking after the surgery and it's possible to get this lined up prior to surgery rather than the day of.  Patient is scheduled for post op visit Friday 02-26-19

## 2019-02-16 NOTE — Telephone Encounter (Signed)
Please advise 

## 2019-02-17 ENCOUNTER — Other Ambulatory Visit: Payer: Self-pay | Admitting: Orthopedic Surgery

## 2019-02-17 ENCOUNTER — Telehealth: Payer: Self-pay | Admitting: Orthopedic Surgery

## 2019-02-17 ENCOUNTER — Encounter: Payer: Self-pay | Admitting: Orthopedic Surgery

## 2019-02-17 MED ORDER — OXYCODONE HCL 5 MG PO TABS: ORAL_TABLET | ORAL | 0 refills | Status: DC

## 2019-02-17 MED ORDER — METHOCARBAMOL 500 MG PO TABS: 500.0000 mg | ORAL_TABLET | Freq: Three times a day (TID) | ORAL | 0 refills | Status: DC | PRN

## 2019-02-17 NOTE — Telephone Encounter (Signed)
meds submitted by Dr Marlou Sa.  Patient advised

## 2019-02-17 NOTE — Telephone Encounter (Signed)
Tried calling, received greeting stating pharmacy closed until 2pm will try to call after 2pm

## 2019-02-17 NOTE — Telephone Encounter (Signed)
Jeremy Fowler with [facility] called in to get clarification on a medication for Oxycodone 5MG , asked for you to please give them a call back. [phone]
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
## 2019-02-17 NOTE — Telephone Encounter (Signed)
IC s/w Jeremy Fowler They were needing a diagnosis for pain medication. This was provided to her.

## 2019-02-17 NOTE — Telephone Encounter (Signed)
As we discussed in her preop meeting he will be in both a light splint plus a sling.  We will call in/please call in oxycodone 5 mg 1 p.o. every 4-6 hours as needed pain and Robaxin 500 every 8 as needed spasm #30 of the Robaxin and #40 of the other one.  Thanks

## 2019-02-18 DIAGNOSIS — S46321A Laceration of muscle, fascia and tendon of triceps, right arm, initial encounter: Secondary | ICD-10-CM | POA: Diagnosis not present

## 2019-02-25 ENCOUNTER — Telehealth: Payer: Self-pay | Admitting: Radiology

## 2019-02-25 ENCOUNTER — Telehealth: Payer: Self-pay

## 2019-02-25 NOTE — Telephone Encounter (Signed)
Patient called. Had an appointment scheduled for today. However he was just notified that his mom tested positive for COVID and he has been in contact with her. Please advise. Contact for patient [phone]

## 2019-02-25 NOTE — Telephone Encounter (Signed)
lmom for patient to return call. Explained office will be closing for a few days possibly and was trying to get him seen before noon today.  Home number is disconnected

## 2019-02-26 ENCOUNTER — Inpatient Hospital Stay: Payer: BLUE CROSS/BLUE SHIELD | Admitting: Orthopedic Surgery

## 2019-02-27 NOTE — Telephone Encounter (Signed)
Patient has appt on Friday

## 2019-02-27 NOTE — Telephone Encounter (Signed)
I called him needs to come in next week on fri 815

## 2019-03-05 ENCOUNTER — Ambulatory Visit (INDEPENDENT_AMBULATORY_CARE_PROVIDER_SITE_OTHER): Payer: BLUE CROSS/BLUE SHIELD | Admitting: Orthopedic Surgery

## 2019-03-05 ENCOUNTER — Other Ambulatory Visit: Payer: Self-pay

## 2019-03-05 ENCOUNTER — Encounter: Payer: Self-pay | Admitting: Orthopedic Surgery

## 2019-03-05 DIAGNOSIS — S46312D Strain of muscle, fascia and tendon of triceps, left arm, subsequent encounter: Secondary | ICD-10-CM

## 2019-03-05 NOTE — Progress Notes (Signed)
   Post-Op Visit Note   Patient: Jeremy Fowler           Date of Birth: Mar 10, 1956           MRN: [id number] Visit Date: 03/05/2019 PCP: Unk Pinto, MD   Assessment & Plan:  Chief Complaint:  Chief Complaint  Patient presents with  . Left Elbow - Routine Post Op   Visit Diagnoses:  1. Rupture of left triceps tendon, subsequent encounter     Plan: Kayla is a patient is now 2 weeks out left elbow delaminated triceps tendon rupture repair.  He is out of the splint.  On exam the repair is intact.  I am not letting him flex past 90 degrees.  Incision is intact.  At this point I just want him to continue working on range of motion from full extension to 90 degrees of flexion with lifting not more than 2 pounds.  Come back in 3 weeks and will likely start therapy then for range of motion past 90 degrees and strengthening.  Follow-Up Instructions: Return in about 3 weeks (around 03/26/2019).   Orders:  No orders of the defined types were placed in this encounter.  No orders of the defined types were placed in this encounter.   Imaging: No results found.  PMFS History: Patient Active Problem List   Diagnosis Date Noted  . Left arm pain 02/09/2019  . Anal fissure 03/14/2016  . Interstitial cystitis 03/05/2016  . History of basal cell carcinoma 03/17/2015  . Vitamin D deficiency 03/17/2015  . Medication management 03/17/2015  . Tobacco use disorder 03/17/2015  . Hyperlipemia   . Hypertension   . Insomnia   . HSV-2 infection   . Prolapsed internal hemorrhoids, grade 2 08/04/2012  . BPH (benign prostatic hyperplasia) 07/29/2012  . Hx of colonic polyps 07/29/2012  . Family hx of colon cancer 07/29/2012   Past Medical History:  Diagnosis Date  . Colon polyp 04-28-2002   Colonoscopy   . HSV-2 infection   . Hyperlipemia   . Hypertension   . Insomnia   . Interstitial cystitis 03/05/2016  . Prolapsed internal hemorrhoids, grade 2 08/04/2012   Grade 2 hemorrhoids  05/11/15  band ligation right posterior hemorrhoidal bundle 06/14/15 band ligation right anterior hemorrhoidal bundle 07/13/15 LL banded   . Prostatitis     Family History  Problem Relation Age of Onset  . Cancer Mother   . Colon cancer Maternal Grandmother        dx in her late 54's    Past Surgical History:  Procedure Laterality Date  . CALCANEAL OSTEOTOMY W/ INTERNAL FIXATION    . COLONOSCOPY  08/04/2012   Procedure: COLONOSCOPY;  Surgeon: Inda Castle, MD;  Location: [facility];  Service: Endoscopy;  Laterality: N/A;  with possible banding  . HEMORRHOID BANDING    . HEMORRHOID BANDING  2016   Social History   Occupational History  . Occupation: Insurance underwriter  Tobacco Use  . Smoking status: Light Tobacco Smoker    Types: Cigars  . Smokeless tobacco: Never Used  . Tobacco comment: Tobacco info given 04/17/15  Substance and Sexual Activity  . Alcohol use: Yes    Alcohol/week: 2.0 standard drinks    Types: 2 Glasses of wine per week  . Drug use: No  . Sexual activity: Not on file

## 2019-03-25 ENCOUNTER — Ambulatory Visit: Payer: BLUE CROSS/BLUE SHIELD | Admitting: Orthopedic Surgery

## 2019-03-26 ENCOUNTER — Ambulatory Visit (INDEPENDENT_AMBULATORY_CARE_PROVIDER_SITE_OTHER): Payer: BC Managed Care – PPO | Admitting: Orthopedic Surgery

## 2019-03-26 ENCOUNTER — Other Ambulatory Visit: Payer: Self-pay

## 2019-03-26 ENCOUNTER — Encounter: Payer: Self-pay | Admitting: Orthopedic Surgery

## 2019-03-26 DIAGNOSIS — S46312D Strain of muscle, fascia and tendon of triceps, left arm, subsequent encounter: Secondary | ICD-10-CM

## 2019-03-26 NOTE — Progress Notes (Signed)
   Post-Op Visit Note   Patient: Jeremy Fowler           Date of Birth: Feb 05, 1956           MRN: [id number] Visit Date: 03/26/2019 PCP: Unk Pinto, MD   Assessment & Plan:  Chief Complaint:  Chief Complaint  Patient presents with  . Left Elbow - Pain   Visit Diagnoses:  1. Rupture of left triceps tendon, subsequent encounter     Plan: Zebastian is now 5 weeks out left elbow triceps rupture repair.  He is doing well.  He has near full range of motion.  Has good extension strength.  Has not done any therapy yet.  On exam the tendon is in continuity along its length.  Incision is intact.  Plan at this time is a progressive course of therapy over 3 weeks to start doing some strengthening exercises.  2 pounds first week 5 pounds weeks 2 and 3 and then progressive resistance work as tolerated.  I will see him back in 6 weeks for final check  Follow-Up Instructions: Return in about 6 weeks (around 05/07/2019).   Orders:  Orders Placed This Encounter  Procedures  . Ambulatory referral to Physical Therapy   No orders of the defined types were placed in this encounter.   Imaging: No results found.  PMFS History: Patient Active Problem List   Diagnosis Date Noted  . Left arm pain 02/09/2019  . Anal fissure 03/14/2016  . Interstitial cystitis 03/05/2016  . History of basal cell carcinoma 03/17/2015  . Vitamin D deficiency 03/17/2015  . Medication management 03/17/2015  . Tobacco use disorder 03/17/2015  . Hyperlipemia   . Hypertension   . Insomnia   . HSV-2 infection   . Prolapsed internal hemorrhoids, grade 2 08/04/2012  . BPH (benign prostatic hyperplasia) 07/29/2012  . Hx of colonic polyps 07/29/2012  . Family hx of colon cancer 07/29/2012   Past Medical History:  Diagnosis Date  . Colon polyp 04-28-2002   Colonoscopy   . HSV-2 infection   . Hyperlipemia   . Hypertension   . Insomnia   . Interstitial cystitis 03/05/2016  . Prolapsed internal hemorrhoids, grade 2  08/04/2012   Grade 2 hemorrhoids  05/11/15 band ligation right posterior hemorrhoidal bundle 06/14/15 band ligation right anterior hemorrhoidal bundle 07/13/15 LL banded   . Prostatitis     Family History  Problem Relation Age of Onset  . Cancer Mother   . Colon cancer Maternal Grandmother        dx in her late 82's    Past Surgical History:  Procedure Laterality Date  . CALCANEAL OSTEOTOMY W/ INTERNAL FIXATION    . COLONOSCOPY  08/04/2012   Procedure: COLONOSCOPY;  Surgeon: Inda Castle, MD;  Location: [facility];  Service: Endoscopy;  Laterality: N/A;  with possible banding  . HEMORRHOID BANDING    . HEMORRHOID BANDING  2016   Social History   Occupational History  . Occupation: Insurance underwriter  Tobacco Use  . Smoking status: Light Tobacco Smoker    Types: Cigars  . Smokeless tobacco: Never Used  . Tobacco comment: Tobacco info given 04/17/15  Substance and Sexual Activity  . Alcohol use: Yes    Alcohol/week: 2.0 standard drinks    Types: 2 Glasses of wine per week  . Drug use: No  . Sexual activity: Not on file

## 2019-05-03 NOTE — Progress Notes (Signed)
Annual  Screening/Preventative Visit  & Comprehensive Evaluation & Examination     This very nice 62 y.o.  MWMpresents for a Screening /Preventative Visit & comprehensive evaluation and management of multiple medical co-morbidities.  Patient has been followed for labile HTN, HLD, Prediabetes and Vitamin D Deficiency.     Patient has been followed expectantly for labile HTN predates since 2003. Patient's BP has been controlled at home.  Today's BP is at goal - 128/84. Patient denies any cardiac symptoms as chest pain, palpitations, shortness of breath, dizziness or ankle swelling.     Patient's hyperlipidemia is not controlled with diet and medications. Patient denies myalgias or other medication SE's. Last lipids were not at goal & he was switched from Pravastatin to Crestor: Lab Results  Component Value Date   CHOL 228 (H) 03/31/2018   HDL 42 03/31/2018   LDLCALC 147 (H) 03/31/2018   TRIG 256 (H) 03/31/2018   CHOLHDL 5.4 (H) 03/31/2018      Patient is monitored proactively for glucose intolerance. He denies reactive hypoglycemic symptoms, visual blurring, diabetic polys or paresthesias. Last A1c was Normal & at goal: Lab Results  Component Value Date   HGBA1C 5.2 03/31/2018        Patient has hx/o a relatively low Testosterone 330 (ideal range  (450 - 837).     Finally, patient has history of Vitamin D Deficiency and last vitamin D was still low: Lab Results  Component Value Date   VD25OH 36 03/31/2018   Current Outpatient Medications on File Prior to Visit  Medication Sig  . acyclovir (ZOVIRAX) 400 MG tablet TAKE 1 TABLET TWICE A DAY  . alfuzosin (UROXATRAL) 10 MG 24 hr tablet TAKE 1 TABLET DAILY WITH BREAKFAST  . Cholecalciferol (VITAMIN D) 50 MCG (2000 UT) tablet Take 2,000 Units by mouth daily.  . Eszopiclone 3 MG TABS TAKE 1 TABLET AT BEDTIME AS NEEDED  . Multiple Vitamin (MULTIVITAMIN) tablet Take 1 tablet by mouth daily.  . Omega-3 Fatty Acids (FISH OIL PO) Take 1  capsule by mouth daily.  . rosuvastatin (CRESTOR) 40 MG tablet Take 1/2 to 1 tablet daily or as directed for Cholesterol   No current facility-administered medications on file prior to visit.    Allergies  Allergen Reactions  . Sulfa Antibiotics     dizzy  . Sulfur Other (See Comments)    disoriented   Past Medical History:  Diagnosis Date  . Colon polyp 04-28-2002   Colonoscopy   . HSV-2 infection   . Hyperlipemia   . Hypertension   . Insomnia   . Interstitial cystitis 03/05/2016  . Prolapsed internal hemorrhoids, grade 2 08/04/2012   Grade 2 hemorrhoids  05/11/15 band ligation right posterior hemorrhoidal bundle 06/14/15 band ligation right anterior hemorrhoidal bundle 07/13/15 LL banded   . Prostatitis    Health Maintenance  Topic Date Due  . INFLUENZA VACCINE  05/01/2019  . COLONOSCOPY  08/04/2022  . TETANUS/TDAP  10/02/2027  . Hepatitis C Screening  Completed  . HIV Screening  Completed   Immunization History  Administered Date(s) Administered  . Influenza-Unspecified 06/30/2018  . PPD Test 03/31/2018  . Td 01/22/2007  . Tdap 10/01/2017  . Zoster Recombinat (Shingrix) 10/01/2017, 11/01/2017   Last Colon - 08/04/2012 - Dr Deatra Ina - Recc 5 yr f/u - due Nov 2018 & sent letter 02/2017 per Dr Ardis Hughs recc f/u Colonoscopy  Past Surgical History:  Procedure Laterality Date  . CALCANEAL OSTEOTOMY W/ INTERNAL FIXATION    .  COLONOSCOPY  08/04/2012   Procedure: COLONOSCOPY;  Surgeon: Inda Castle, MD;  Location: [facility];  Service: Endoscopy;  Laterality: N/A;  with possible banding  . HEMORRHOID BANDING    . HEMORRHOID BANDING  2016   Family History  Problem Relation Age of Onset  . Cancer Mother   . Colon cancer Maternal Grandmother        dx in her late 49's   Social History   Socioeconomic History  . Marital status: Married    Spouse name: Eyvonne Mechanic  . Number of children: 2  . Years of education: Not on file  . Highest education level: Not on file   Occupational History  . Occupation: Programme researcher, broadcasting/film/video  Tobacco Use  . Smoking status: Light Tobacco Smoker    Types: Cigars  . Smokeless tobacco: Never Used  . Tobacco comment: Tobacco info given 04/17/15  Substance and Sexual Activity  . Alcohol use: Yes    Alcohol/week: 2.0 standard drinks    Types: 2 Glasses of wine per week  . Drug use: No  . Sexual activity: Not on file    ROS Constitutional: Denies fever, chills, weight loss/gain, headaches, insomnia,  night sweats or change in appetite. Does c/o fatigue. Eyes: Denies redness, blurred vision, diplopia, discharge, itchy or watery eyes.  ENT: Denies discharge, congestion, post nasal drip, epistaxis, sore throat, earache, hearing loss, dental pain, Tinnitus, Vertigo, Sinus pain or snoring.  Cardio: Denies chest pain, palpitations, irregular heartbeat, syncope, dyspnea, diaphoresis, orthopnea, PND, claudication or edema Respiratory: denies cough, dyspnea, DOE, pleurisy, hoarseness, laryngitis or wheezing.  Gastrointestinal: Denies dysphagia, heartburn, reflux, water brash, pain, cramps, nausea, vomiting, bloating, diarrhea, constipation, hematemesis, melena, hematochezia, jaundice or hemorrhoids Genitourinary: Denies dysuria, frequency, urgency, nocturia, hesitancy, discharge, hematuria or flank pain Musculoskeletal: Denies arthralgia, myalgia, stiffness, Jt. Swelling, pain, limp or strain/sprain. Denies Falls. Skin: Denies puritis, rash, hives, warts, acne, eczema or change in skin lesion Neuro: No weakness, tremor, incoordination, spasms, paresthesia or pain Psychiatric: Denies confusion, memory loss or sensory loss. Denies Depression. Endocrine: Denies change in weight, skin, hair change, nocturia, and paresthesia, diabetic polys, visual blurring or hyper / hypo glycemic episodes.  Heme/Lymph: No excessive bleeding, bruising or enlarged lymph nodes.  Physical Exam  BP 128/84   Pulse 60   Temp 97.9 F (36.6 C)   Resp 16   Ht 5'  11" (1.803 m)   Wt 213 lb (96.6 kg)   BMI 29.71 kg/m   General Appearance: Well nourished and well groomed and in no apparent distress.  Eyes: PERRLA, EOMs, conjunctiva no swelling or erythema, normal fundi and vessels. Sinuses: No frontal/maxillary tenderness ENT/Mouth: EACs patent / TMs  nl. Nares clear without erythema, swelling, mucoid exudates. Oral hygiene is good. No erythema, swelling, or exudate. Tongue normal, non-obstructing. Tonsils not swollen or erythematous. Hearing normal.  Neck: Supple, thyroid not palpable. No bruits, nodes or JVD. Respiratory: Respiratory effort normal.  BS equal and clear bilateral without rales, rhonci, wheezing or stridor. Cardio: Heart sounds are normal with regular rate and rhythm and no murmurs, rubs or gallops. Peripheral pulses are normal and equal bilaterally without edema. No aortic or femoral bruits. Chest: symmetric with normal excursions and percussion.  Abdomen: Soft, with Nl bowel sounds. Nontender, no guarding, rebound, hernias, masses, or organomegaly.  Lymphatics: Non tender without lymphadenopathy.  Musculoskeletal: Full ROM all peripheral extremities, joint stability, 5/5 strength, and normal gait. Skin: Warm and dry without rashes, lesions, cyanosis, clubbing or  ecchymosis.  Neuro: Cranial nerves  intact, reflexes equal bilaterally. Normal muscle tone, no cerebellar symptoms. Sensation intact.  Pysch: Alert and oriented X 3 with normal affect, insight and judgment appropriate.   Assessment and Plan  1. Annual Preventative/Screening Exam    2. Labile hypertension  - EKG 12-Lead - [place], RETROPERITNL ABD,  LTD - Urinalysis, Routine w reflex microscopic - Microalbumin / creatinine urine ratio - COMPLETE METABOLIC PANEL WITH GFR - Magnesium - TSH - CBC with Differential/Platelet  3. Hyperlipidemia, mixed  - EKG 12-Lead - [place], RETROPERITNL ABD,  LTD - Lipid panel - TSH  4. Abnormal glucose  - EKG 12-Lead - [place], RETROPERITNL  ABD,  LTD - Hemoglobin A1c - Insulin, random  5. Vitamin D deficiency  - VITAMIN D 25 Hydroxyl  6. BPH with obstruction/lower urinary tract symptoms  - PSA  7. Screening examination for pulmonary tuberculosis  - TB Skin Test  8. Screening for colorectal cancer  - POC Hemoccult Bld/Stl  9. Prostate cancer screening  - PSA  10. Screening for ischemic heart disease  - EKG 12-Lead  11. Smoker  - EKG 12-Lead - [place], RETROPERITNL ABD,  LTD  12. Screening for AAA (aortic abdominal aneurysm)  - EKG 12-Lead - [place], RETROPERITNL ABD,  LTD  13. Fatigue, unspecified type  - Iron,Total/Total Iron Binding Cap - Vitamin B12 - Testosterone  14. Medication management  - Urinalysis, Routine w reflex microscopic - Microalbumin / creatinine urine ratio - COMPLETE METABOLIC PANEL WITH GFR - Magnesium - Lipid panel - TSH - Hemoglobin A1c - VITAMIN D 25 Hydroxy - CBC with Differential/Platelet - Insulin, random       Patient was counseled in prudent diet, weight control to achieve/maintain BMI less than 25, BP monitoring, regular exercise and medications as discussed.  Discussed med effects and SE's. Routine screening labs and tests as requested with regular follow-up as recommended. Over 40 minutes of exam, counseling, chart review and high complex critical decision making was performed   Kirtland Bouchard, MD

## 2019-05-04 ENCOUNTER — Ambulatory Visit (INDEPENDENT_AMBULATORY_CARE_PROVIDER_SITE_OTHER): Payer: BC Managed Care – PPO | Admitting: Internal Medicine

## 2019-05-04 ENCOUNTER — Other Ambulatory Visit: Payer: Self-pay

## 2019-05-04 ENCOUNTER — Encounter: Payer: Self-pay | Admitting: Internal Medicine

## 2019-05-04 VITALS — BP 128/84 | HR 60 | Temp 97.9°F | Resp 16 | Ht 71.0 in | Wt 213.0 lb

## 2019-05-04 DIAGNOSIS — Z125 Encounter for screening for malignant neoplasm of prostate: Secondary | ICD-10-CM | POA: Diagnosis not present

## 2019-05-04 DIAGNOSIS — R5383 Other fatigue: Secondary | ICD-10-CM | POA: Diagnosis not present

## 2019-05-04 DIAGNOSIS — F17200 Nicotine dependence, unspecified, uncomplicated: Secondary | ICD-10-CM | POA: Diagnosis not present

## 2019-05-04 DIAGNOSIS — E559 Vitamin D deficiency, unspecified: Secondary | ICD-10-CM

## 2019-05-04 DIAGNOSIS — N138 Other obstructive and reflux uropathy: Secondary | ICD-10-CM

## 2019-05-04 DIAGNOSIS — Z111 Encounter for screening for respiratory tuberculosis: Secondary | ICD-10-CM

## 2019-05-04 DIAGNOSIS — E782 Mixed hyperlipidemia: Secondary | ICD-10-CM | POA: Diagnosis not present

## 2019-05-04 DIAGNOSIS — Z0001 Encounter for general adult medical examination with abnormal findings: Secondary | ICD-10-CM | POA: Diagnosis not present

## 2019-05-04 DIAGNOSIS — Z87891 Personal history of nicotine dependence: Secondary | ICD-10-CM

## 2019-05-04 DIAGNOSIS — R7309 Other abnormal glucose: Secondary | ICD-10-CM

## 2019-05-04 DIAGNOSIS — Z79899 Other long term (current) drug therapy: Secondary | ICD-10-CM

## 2019-05-04 DIAGNOSIS — Z136 Encounter for screening for cardiovascular disorders: Secondary | ICD-10-CM

## 2019-05-04 DIAGNOSIS — Z1211 Encounter for screening for malignant neoplasm of colon: Secondary | ICD-10-CM

## 2019-05-04 DIAGNOSIS — N401 Enlarged prostate with lower urinary tract symptoms: Secondary | ICD-10-CM

## 2019-05-04 DIAGNOSIS — R0989 Other specified symptoms and signs involving the circulatory and respiratory systems: Secondary | ICD-10-CM

## 2019-05-04 MED ORDER — PREDNISONE 20 MG PO TABS: ORAL_TABLET | ORAL | 1 refills | Status: DC

## 2019-05-04 NOTE — Patient Instructions (Signed)

## 2019-05-05 LAB — COMPLETE METABOLIC PANEL WITH GFR
AG Ratio: 1.8 (calc) (ref 1.0–2.5)
ALT: 36 U/L (ref 9–46)
AST: 26 U/L (ref 10–35)
Albumin: 4.8 g/dL (ref 3.6–5.1)
Alkaline phosphatase (APISO): 83 U/L (ref 35–144)
BUN: 14 mg/dL (ref 7–25)
CO2: 27 mmol/L (ref 20–32)
Calcium: 10 mg/dL (ref 8.6–10.3)
Chloride: 101 mmol/L (ref 98–110)
Creat: 1.06 mg/dL (ref 0.70–1.25)
GFR, Est African American: 86 mL/min/{1.73_m2} (ref >=60)
GFR, Est Non African American: 74 mL/min/{1.73_m2} (ref >=60)
Globulin: 2.6 g/dL (calc) (ref 1.9–3.7)
Glucose, Bld: 111 mg/dL — ABNORMAL HIGH (ref 65–99)
Potassium: 4.2 mmol/L (ref 3.5–5.3)
Sodium: 139 mmol/L (ref 135–146)
Total Bilirubin: 1 mg/dL (ref 0.2–1.2)
Total Protein: 7.4 g/dL (ref 6.1–8.1)

## 2019-05-05 LAB — LIPID PANEL
Cholesterol: 219 mg/dL — ABNORMAL HIGH (ref <200)
HDL: 52 mg/dL (ref >=40)
LDL Cholesterol (Calc): 128 mg/dL (calc) — ABNORMAL HIGH
Non-HDL Cholesterol (Calc): 167 mg/dL (calc) — ABNORMAL HIGH (ref <130)
Total CHOL/HDL Ratio: 4.2 (calc) (ref <5.0)
Triglycerides: 251 mg/dL — ABNORMAL HIGH (ref <150)

## 2019-05-05 LAB — URINALYSIS, ROUTINE W REFLEX MICROSCOPIC
Bilirubin Urine: NEGATIVE
Glucose, UA: NEGATIVE
Hgb urine dipstick: NEGATIVE
Ketones, ur: NEGATIVE
Leukocytes,Ua: NEGATIVE
Nitrite: NEGATIVE
Protein, ur: NEGATIVE
Specific Gravity, Urine: 1.009 (ref 1.001–1.03)
pH: 5.5 (ref 5.0–8.0)

## 2019-05-05 LAB — HEMOGLOBIN A1C
Hgb A1c MFr Bld: 5.3 % of total Hgb (ref <5.7)
Mean Plasma Glucose: 105 (calc)
eAG (mmol/L): 5.8 (calc)

## 2019-05-05 LAB — CBC WITH DIFFERENTIAL/PLATELET
Absolute Monocytes: 479 cells/uL (ref 200–950)
Basophils Absolute: 47 cells/uL (ref 0–200)
Basophils Relative: 0.5 %
Eosinophils Absolute: 132 cells/uL (ref 15–500)
Eosinophils Relative: 1.4 %
HCT: 45.7 % (ref 38.5–50.0)
Hemoglobin: 15.8 g/dL (ref 13.2–17.1)
Lymphs Abs: 1814 cells/uL (ref 850–3900)
MCH: 32.7 pg (ref 27.0–33.0)
MCHC: 34.6 g/dL (ref 32.0–36.0)
MCV: 94.6 fL (ref 80.0–100.0)
MPV: 11 fL (ref 7.5–12.5)
Monocytes Relative: 5.1 %
Neutro Abs: 6928 cells/uL (ref 1500–7800)
Neutrophils Relative %: 73.7 %
Platelets: 199 10*3/uL (ref 140–400)
RBC: 4.83 10*6/uL (ref 4.20–5.80)
RDW: 12.7 % (ref 11.0–15.0)
Total Lymphocyte: 19.3 %
WBC: 9.4 10*3/uL (ref 3.8–10.8)

## 2019-05-05 LAB — IRON, TOTAL/TOTAL IRON BINDING CAP
%SAT: 30 % (calc) (ref 20–48)
Iron: 96 ug/dL (ref 50–180)
TIBC: 316 mcg/dL (calc) (ref 250–425)

## 2019-05-05 LAB — VITAMIN D 25 HYDROXY (VIT D DEFICIENCY, FRACTURES): Vit D, 25-Hydroxy: 44 ng/mL (ref 30–100)

## 2019-05-05 LAB — TSH: TSH: 2.49 mIU/L (ref 0.40–4.50)

## 2019-05-05 LAB — MAGNESIUM: Magnesium: 2.1 mg/dL (ref 1.5–2.5)

## 2019-05-05 LAB — MICROALBUMIN / CREATININE URINE RATIO
Creatinine, Urine: 72 mg/dL (ref 20–320)
Microalb Creat Ratio: 4 mcg/mg creat (ref <30)
Microalb, Ur: 0.3 mg/dL

## 2019-05-05 LAB — VITAMIN B12: Vitamin B-12: 692 pg/mL (ref 200–1100)

## 2019-05-05 LAB — PSA: PSA: 2.2 ng/mL (ref <=4.0)

## 2019-05-05 LAB — INSULIN, RANDOM: Insulin: 4.6 u[IU]/mL

## 2019-05-05 LAB — TESTOSTERONE: Testosterone: 367 ng/dL (ref 250–827)

## 2019-05-26 ENCOUNTER — Other Ambulatory Visit: Payer: Self-pay | Admitting: Internal Medicine

## 2019-05-26 DIAGNOSIS — E782 Mixed hyperlipidemia: Secondary | ICD-10-CM

## 2019-05-26 MED ORDER — ROSUVASTATIN CALCIUM 20 MG PO TABS: ORAL_TABLET | ORAL | 1 refills | Status: DC

## 2019-05-27 ENCOUNTER — Other Ambulatory Visit: Payer: Self-pay | Admitting: Internal Medicine

## 2019-05-27 DIAGNOSIS — E782 Mixed hyperlipidemia: Secondary | ICD-10-CM

## 2019-05-27 MED ORDER — ROSUVASTATIN CALCIUM 20 MG PO TABS: ORAL_TABLET | ORAL | 1 refills | Status: DC

## 2019-06-11 ENCOUNTER — Other Ambulatory Visit: Payer: Self-pay | Admitting: Internal Medicine

## 2019-06-11 DIAGNOSIS — E782 Mixed hyperlipidemia: Secondary | ICD-10-CM

## 2019-06-11 MED ORDER — ROSUVASTATIN CALCIUM 20 MG PO TABS: ORAL_TABLET | ORAL | 1 refills | Status: DC

## 2019-06-23 ENCOUNTER — Ambulatory Visit (INDEPENDENT_AMBULATORY_CARE_PROVIDER_SITE_OTHER): Payer: BC Managed Care – PPO | Admitting: Orthopedic Surgery

## 2019-06-23 DIAGNOSIS — S46312D Strain of muscle, fascia and tendon of triceps, left arm, subsequent encounter: Secondary | ICD-10-CM

## 2019-06-27 ENCOUNTER — Encounter: Payer: Self-pay | Admitting: Orthopedic Surgery

## 2019-06-27 NOTE — Progress Notes (Signed)
   Post-Op Visit Note   Patient: Jeremy Fowler           Date of Birth: 1956-06-02           MRN: [id number] Visit Date: 06/23/2019 PCP: Unk Pinto, MD   Assessment & Plan:  Chief Complaint:  Chief Complaint  Patient presents with  . Left Elbow - Follow-up   Visit Diagnoses:  1. Rupture of left triceps tendon, subsequent encounter     Plan: Witt is a patient with left elbow triceps rupture repair 11 weeks ago.  He has been doing well.  He has excellent range of motion and strength.  No defect palpable within the triceps tendon attachment.  He also describes some back and right hip pain for about 6 weeks.  Is been doing a home exercise program for that.  No groin pain.  Worse when he goes from sitting to standing.  This sounds like facet arthritis.  He will come back in and have that worked up in the future if he cannot work it out on his own with some exercises.  I will see him back as needed.  Follow-Up Instructions: Return if symptoms worsen or fail to improve.   Orders:  No orders of the defined types were placed in this encounter.  No orders of the defined types were placed in this encounter.   Imaging: No results found.  PMFS History: Patient Active Problem List   Diagnosis Date Noted  . Left arm pain 02/09/2019  . Anal fissure 03/14/2016  . Interstitial cystitis 03/05/2016  . History of basal cell carcinoma 03/17/2015  . Vitamin D deficiency 03/17/2015  . Medication management 03/17/2015  . Tobacco use disorder 03/17/2015  . Hyperlipemia   . Hypertension   . Insomnia   . HSV-2 infection   . Prolapsed internal hemorrhoids, grade 2 08/04/2012  . BPH (benign prostatic hyperplasia) 07/29/2012  . Hx of colonic polyps 07/29/2012  . Family hx of colon cancer 07/29/2012   Past Medical History:  Diagnosis Date  . Colon polyp 04-28-2002   Colonoscopy   . HSV-2 infection   . Hyperlipemia   . Hypertension   . Insomnia   . Interstitial cystitis 03/05/2016  .  Prolapsed internal hemorrhoids, grade 2 08/04/2012   Grade 2 hemorrhoids  05/11/15 band ligation right posterior hemorrhoidal bundle 06/14/15 band ligation right anterior hemorrhoidal bundle 07/13/15 LL banded   . Prostatitis     Family History  Problem Relation Age of Onset  . Cancer Mother   . Colon cancer Maternal Grandmother        dx in her late 27's    Past Surgical History:  Procedure Laterality Date  . CALCANEAL OSTEOTOMY W/ INTERNAL FIXATION    . COLONOSCOPY  08/04/2012   Procedure: COLONOSCOPY;  Surgeon: Inda Castle, MD;  Location: [facility];  Service: Endoscopy;  Laterality: N/A;  with possible banding  . HEMORRHOID BANDING    . HEMORRHOID BANDING  2016   Social History   Occupational History  . Occupation: Insurance underwriter  Tobacco Use  . Smoking status: Light Tobacco Smoker    Types: Cigars  . Smokeless tobacco: Never Used  . Tobacco comment: Tobacco info given 04/17/15  Substance and Sexual Activity  . Alcohol use: Yes    Alcohol/week: 2.0 standard drinks    Types: 2 Glasses of wine per week  . Drug use: No  . Sexual activity: Not on file

## 2019-09-07 ENCOUNTER — Other Ambulatory Visit: Payer: Self-pay | Admitting: Internal Medicine

## 2019-09-07 ENCOUNTER — Other Ambulatory Visit: Payer: Self-pay | Admitting: Physician Assistant

## 2019-09-14 ENCOUNTER — Telehealth: Payer: Self-pay | Admitting: Gastroenterology

## 2019-09-14 ENCOUNTER — Ambulatory Visit: Payer: BC Managed Care – PPO | Admitting: Gastroenterology

## 2019-09-14 NOTE — Telephone Encounter (Signed)
Dr Ardis Hughs please review in Paula's absence. Patient last seen 09/05/17 for external hemorrhoid. He hashad banding 2016 and 2017. He was contacted by the office today to cancel his appointment for the 2nd time. His complaint is itching and burning of the area around the rectum upward involving his buttocks. His wife is a Writer. She has had him using a combination of Desitin, clotrimazole and hydrocortisone for 2 days. He is a little better with less redness. He had seen bright red blood with his stool last week, but this has not happened again. He also describes a "bad experience" after the earlier bandings. See your note of 07/24/15. Please advise. There are no APP openings or doctor schedules. However if you will clear it with Dr Carlean Purl, he has an opening this Thursday.

## 2019-09-14 NOTE — Telephone Encounter (Signed)
I am happy to see the patient on Thursday as long as the patient is ok since he had a "bad experience".  He had previously seen Dr. Deatra Ina

## 2019-09-14 NOTE — Telephone Encounter (Signed)
Patient is in agreement with this plan. Happy to see Dr Carlean Purl. Appointment Thursday 9:30 am

## 2019-09-14 NOTE — Telephone Encounter (Signed)
Jeremy Fowler, See the phone note from [facility].  Are you able to see him on Thursday?  I am actually not sure that he is my primary patient anyway, I think I only saw him when he was having some significant difficulties and needed to be worked in after a banding procedure. Thanks  dj

## 2019-09-16 ENCOUNTER — Ambulatory Visit (INDEPENDENT_AMBULATORY_CARE_PROVIDER_SITE_OTHER): Payer: BC Managed Care – PPO | Admitting: Internal Medicine

## 2019-09-16 ENCOUNTER — Other Ambulatory Visit: Payer: Self-pay

## 2019-09-16 ENCOUNTER — Encounter: Payer: Self-pay | Admitting: Internal Medicine

## 2019-09-16 VITALS — BP 146/84 | HR 72 | Temp 98.4°F | Ht 71.0 in | Wt 222.0 lb

## 2019-09-16 DIAGNOSIS — L309 Dermatitis, unspecified: Secondary | ICD-10-CM | POA: Diagnosis not present

## 2019-09-16 DIAGNOSIS — K641 Second degree hemorrhoids: Secondary | ICD-10-CM

## 2019-09-16 MED ORDER — CLOTRIMAZOLE-BETAMETHASONE 1-0.05 % EX CREA: 1.0000 "application " | TOPICAL_CREAM | Freq: Two times a day (BID) | CUTANEOUS | 1 refills | Status: DC

## 2019-09-16 NOTE — Assessment & Plan Note (Signed)
He has improved using over-the-counter antifungal and steroid cream.  I have prescribed generic Mycolog which is betamethasone and Chlortrimazole to use to finish out his treatment course and use as needed.  We will plan to treat the hemorrhoids that seem to be driving his flares of this.

## 2019-09-16 NOTE — Progress Notes (Signed)
Jeremy Fowler 63 y.o. 10/23/1955 [id number]  Assessment & Plan:  Prolapsed internal hemorrhoids, grade 2 These are probably driving his overall symptom complex is when they flare he seems to get his perianal dermatitis problems.  He is improved using an U-Cort HC suppositories at this point.  He does desire additional banding we will schedule that for January.  At that time would probably have him prophylactically use steroid antifungal cream as he has had flares of dermatitis after hemorrhoidal banding.  Perianal dermatitis He has improved using over-the-counter antifungal and steroid cream.  I have prescribed generic Mycolog which is betamethasone and Chlortrimazole to use to finish out his treatment course and use as needed.  We will plan to treat the hemorrhoids that seem to be driving his flares of this.      Subjective:   Chief Complaint: Hemorrhoids and perianal rash  HPI The patient is here after calling in complaining of perianal itching which began after some rectal bleeding began recently.  He has a history of hemorrhoids and banding and previous colonoscopy without serious abnormalities.  See previous notes for these details but recently has hemorrhoids were swollen some irritated some bleeding and then he developed a perianal dermatitis with itching and rash like he has had in the past and he has been using over-the-counter Chlortrimazole and over-the-counter hydrocortisone and Desitin ointment.  He is better but still having some symptoms.  Much better though.  The rectal bleeding has stopped.  He was probably doing some lifting more than usual, lately that might have triggered the hemorrhoids.  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  Discusses how his mother with dementia is in a nursing home in [place].  Hoping to get the visit her again though the pandemic makes that difficult.  No other changes in bowel habits or anything like that does not  complain of abdominal pain.  Would like definitive treatment of these problems. Allergies  Allergen Reactions  . Sulfa Antibiotics     dizzy  . Sulfur Other (See Comments)    disoriented   Current Meds  Medication Sig  . acyclovir (ZOVIRAX) 400 MG tablet TAKE 1 TABLET TWICE A DAY  . alfuzosin (UROXATRAL) 10 MG 24 hr tablet Take 1 tablet Daily for Prostate  . ANUCORT-HC 25 MG suppository INSERT ONE SUPPOSITORY RECTALLY THREE TIMES DAILY TO 4 TIMES DAILY  . Cholecalciferol (VITAMIN D) 50 MCG (2000 UT) tablet Take 2,000 Units by mouth daily.  . Eszopiclone 3 MG TABS TAKE 1 TABLET AT BEDTIME AS NEEDED  . Multiple Vitamin (MULTIVITAMIN) tablet Take 1 tablet by mouth daily.  . Omega-3 Fatty Acids (FISH OIL PO) Take 1 capsule by mouth daily.  . predniSONE (DELTASONE) 20 MG tablet Take 1 tablet 2 to 3 x /day as directed  . rosuvastatin (CRESTOR) 20 MG tablet Take1 tablet daily for Cholesterol   Past Medical History:  Diagnosis Date  . Colon polyp 04-28-2002   Colonoscopy   . HSV-2 infection   . Hyperlipemia   . Hypertension   . Insomnia   . Interstitial cystitis 03/05/2016  . Prolapsed internal hemorrhoids, grade 2 08/04/2012   Grade 2 hemorrhoids  05/11/15 band ligation right posterior hemorrhoidal bundle 06/14/15 band ligation right anterior hemorrhoidal bundle 07/13/15 LL banded   . Prostatitis    Past Surgical History:  Procedure Laterality Date  . CALCANEAL OSTEOTOMY W/ INTERNAL FIXATION    . COLONOSCOPY  08/04/2012   Procedure: COLONOSCOPY;  Surgeon: Sandy Salaam  Deatra Ina, MD;  Location: [facility];  Service: Endoscopy;  Laterality: N/A;  with possible banding  . HEMORRHOID BANDING    . 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   Review of Systems As above  Objective:   Physical Exam BP (!) 146/84   Pulse 72   Temp 98.4 F (36.9 C) (Oral)   Ht 5\' 11"  (1.803 m)   Wt 222 lb (100.7 kg)   BMI 30.96 kg/m  No acute distress middle-aged white man looking well   Rectal exam in the perianal area there is a bit of adherent whitish-cream seen and slight perianal erythema around the anus itself and some scaliness in the periphery and some mild hyperpigmentation further out from the anal orifice.  No skin breakdown.  It is nontender.  Slight bulge of the left lateral external hemorrhoid complex very minimal.  Digital exam moderate length anal canal no mass nontender brown stool.  Prostate appears normal.  Anoscopic exam demonstrates grade 2 inflamed internal hemorrhoid complexes in all 3 positions probably right posterior and left lateral are the worst.  No frank bleeding.  No masses seen.  Rectal mucosa otherwise looks normal.

## 2019-09-16 NOTE — Assessment & Plan Note (Signed)
These are probably driving his overall symptom complex is when they flare he seems to get his perianal dermatitis problems.  He is improved using an U-Cort HC suppositories at this point.  He does desire additional banding we will schedule that for January.  At that time would probably have him prophylactically use steroid antifungal cream as he has had flares of dermatitis after hemorrhoidal banding.

## 2019-09-16 NOTE — Patient Instructions (Addendum)
We have sent the following medications to your pharmacy for you to pick up at your convenience: mycolog   We will see you in January for a banding appointment.   I appreciate the opportunity to care for you. Silvano Rusk, MD, [facility]

## 2019-09-17 ENCOUNTER — Ambulatory Visit: Payer: BC Managed Care – PPO | Admitting: Nurse Practitioner

## 2019-10-12 ENCOUNTER — Ambulatory Visit (INDEPENDENT_AMBULATORY_CARE_PROVIDER_SITE_OTHER): Payer: BC Managed Care – PPO | Admitting: Internal Medicine

## 2019-10-12 ENCOUNTER — Encounter: Payer: Self-pay | Admitting: Internal Medicine

## 2019-10-12 DIAGNOSIS — L309 Dermatitis, unspecified: Secondary | ICD-10-CM

## 2019-10-12 DIAGNOSIS — K641 Second degree hemorrhoids: Secondary | ICD-10-CM | POA: Diagnosis not present

## 2019-10-12 NOTE — Assessment & Plan Note (Signed)
better 

## 2019-10-12 NOTE — Patient Instructions (Addendum)
Please follow up with Dr Carlean Purl as needed.     HEMORRHOID BANDING PROCEDURE    FOLLOW-UP CARE   1. The procedure you have had should have been relatively painless since the banding of the area involved does not have nerve endings and there is no pain sensation.  The rubber band cuts off the blood supply to the hemorrhoid and the band may fall off as soon as 48 hours after the banding (the band may occasionally be seen in the toilet bowl following a bowel movement). You may notice a temporary feeling of fullness in the rectum which should respond adequately to plain Tylenol or Motrin.  2. Following the banding, avoid strenuous exercise that evening and resume full activity the next day.  A sitz bath (soaking in a warm tub) or bidet is soothing, and can be useful for cleansing the area after bowel movements.     3. To avoid constipation, take two tablespoons of natural wheat bran, natural oat bran, flax, Benefiber or any over the counter fiber supplement and increase your water intake to 7-8 glasses daily.    4. Unless you have been prescribed anorectal medication, do not put anything inside your rectum for two weeks: No suppositories, enemas, fingers, etc.  5. Occasionally, you may have more bleeding than usual after the banding procedure.  This is often from the untreated hemorrhoids rather than the treated one.  Don't be concerned if there is a tablespoon or so of blood.  If there is more blood than this, lie flat with your bottom higher than your head and apply an ice pack to the area. If the bleeding does not stop within a half an hour or if you feel faint, call our office at [phone] or go to the emergency room.  6. Problems are not common; however, if there is a substantial amount of bleeding, severe pain, chills, fever or difficulty passing urine (very rare) or other problems, you should call us at [phone] or report to the nearest emergency room.  7. Do not stay seated  continuously for more than 2-3 hours for a day or two after the procedure.  Tighten your buttock muscles 10-15 times every two hours and take 10-15 deep breaths every 1-2 hours.  Do not spend more than a few minutes on the toilet if you cannot empty your bowel; instead re-visit the toilet at a later time.    I appreciate the opportunity to care for you. Silvano Rusk, MD, [facility]

## 2019-10-12 NOTE — Assessment & Plan Note (Signed)
Banded RP and LL F/u prn

## 2019-10-12 NOTE — Progress Notes (Signed)
   Hemorrhoid ligation  The patient is here for hemorrhoid ligation he was seen in December he had perianal dermatitis and the thought was he was having fecal seepage again related to his prolapsing hemorrhoids which had been treated with banding in the past.  He used Lotrisone cream and has had a good response with his perianal dermatitis.  Ready for banding today.  I had previously identified left lateral and right posterior as the likely candidates. Digital rectal exam nontender no mass. PROCEDURE NOTE: The patient presents with symptomatic grade 2 hemorrhoids, requesting rubber band ligation of his/her hemorrhoidal disease.  All risks, benefits and alternative forms of therapy were described and informed consent was obtained.   The anorectum was pre-medicated with 0.125% nitroglycerin and 5% lidocaine topical The decision was made to band the left lateral and right posterior internal hemorrhoids, and the Catron was used to perform band ligation without complication.  Digital anorectal examination was then performed to assure proper positioning of the band, and to adjust the banded tissue as required.  The patient was discharged home without pain or other issues.  Dietary and behavioral recommendations were given and along with follow-up instructions.     The following adjunctive treatments were recommended:  Lotrisone cream as needed  The patient will return as needed for follow-up and possible additional banding as required. No complications were encountered and the patient tolerated the procedure well.  I appreciate the opportunity to care for this patient. CC: Unk Pinto, MD

## 2019-12-08 ENCOUNTER — Ambulatory Visit (INDEPENDENT_AMBULATORY_CARE_PROVIDER_SITE_OTHER): Payer: BC Managed Care – PPO

## 2019-12-08 ENCOUNTER — Encounter: Payer: Self-pay | Admitting: Orthopedic Surgery

## 2019-12-08 ENCOUNTER — Other Ambulatory Visit: Payer: Self-pay

## 2019-12-08 ENCOUNTER — Ambulatory Visit (INDEPENDENT_AMBULATORY_CARE_PROVIDER_SITE_OTHER): Payer: BC Managed Care – PPO | Admitting: Orthopedic Surgery

## 2019-12-08 DIAGNOSIS — M25521 Pain in right elbow: Secondary | ICD-10-CM | POA: Diagnosis not present

## 2019-12-09 ENCOUNTER — Encounter: Payer: Self-pay | Admitting: Orthopedic Surgery

## 2019-12-09 NOTE — Progress Notes (Signed)
Office Visit Note   Patient: Jeremy Fowler           Date of Birth: Jan 03, 1956           MRN: [id number] Visit Date: 12/08/2019 Requested by: Unk Pinto, [address] [place],  [place] [address] PCP: Unk Pinto, MD  Subjective: Chief Complaint  Patient presents with  . Right Elbow - Pain    HPI: Jeremy Fowler is a patient with right arm pain.  He [place] and slipped and fell on his outstretched right arm a few weeks ago.  Had fairly intense pain.  Had triceps repair June 2020 and did well with that.  He has pain with certain movements but no instability.  Denied any swelling or bruising at the time.  Has done some conservative treatment.  Denies any mechanical symptoms in the elbow.              ROS: All systems reviewed are negative as they relate to the chief complaint within the history of present illness.  Patient denies  fevers or chills.   Assessment & Plan: Visit Diagnoses:  1. Pain in right elbow     Plan: Impression is right arm elbow pain with full range of motion today and normal radiographs.  He may have a bone bruise or a nondisplaced and nonapparent radial head fracture.  Triceps is intact.  I would favor observation but if his symptoms do not continue to improve over the next 6 weeks he should come back for repeat evaluation and possible further imaging.  No joint effusion today or mechanical symptoms.  Follow-Up Instructions: No follow-ups on file.   Orders:  Orders Placed This Encounter  Procedures  . XR Elbow 2 Views Right   No orders of the defined types were placed in this encounter.     Procedures: No procedures performed   Clinical Data: No additional findings.  Objective: Vital Signs: There were no vitals taken for this visit.  Physical Exam:   Constitutional: Patient appears well-developed HEENT:  Head: Normocephalic Eyes:EOM are normal Neck: Normal range of motion Cardiovascular: Normal  rate Pulmonary/chest: Effort normal Neurologic: Patient is alert Skin: Skin is warm Psychiatric: Patient has normal mood and affect    Ortho Exam: Ortho exam demonstrates full active and passive range of motion of the right arm.  Full pronation supination.  No discrete tenderness on the radial head lateral condyle or medial condyle.  Extensor mechanism is intact.  Radial pulses intact.  Biceps is palpable and functional.  Specialty Comments:  No specialty comments available.  Imaging: No results found.   PMFS History: Patient Active Problem List   Diagnosis Date Noted  . Perianal dermatitis 09/16/2019  . Left arm pain 02/09/2019  . Interstitial cystitis 03/05/2016  . History of basal cell carcinoma 03/17/2015  . Vitamin D deficiency 03/17/2015  . Medication management 03/17/2015  . Tobacco use disorder 03/17/2015  . Hyperlipemia   . Hypertension   . Insomnia   . HSV-2 infection   . Prolapsed internal hemorrhoids, grade 2 08/04/2012  . BPH (benign prostatic hyperplasia) 07/29/2012  . Hx of colonic polyps 07/29/2012  . Family hx of colon cancer to grandparents 07/29/2012   Past Medical History:  Diagnosis Date  . Anal fissure 03/14/2016  . Colon polyp 04-28-2002   Colonoscopy   . HSV-2 infection   . Hyperlipemia   . Hypertension   . Insomnia   . Interstitial cystitis 03/05/2016  .  Prolapsed internal hemorrhoids, grade 2 08/04/2012   Grade 2 hemorrhoids  05/11/15 band ligation right posterior hemorrhoidal bundle 06/14/15 band ligation right anterior hemorrhoidal bundle 07/13/15 LL banded   . Prostatitis     Family History  Problem Relation Age of Onset  . Cancer Mother   . Colon cancer Maternal Grandmother        dx in her late 53's    Past Surgical History:  Procedure Laterality Date  . CALCANEAL OSTEOTOMY W/ INTERNAL FIXATION    . COLONOSCOPY  08/04/2012   Procedure: COLONOSCOPY;  Surgeon: Inda Castle, MD;  Location: [facility];  Service: Endoscopy;   Laterality: N/A;  with possible banding  . HEMORRHOID BANDING    . HEMORRHOID BANDING  2016   Social History   Occupational History  . Occupation: Insurance underwriter    Comment: Retired  Tobacco Use  . Smoking status: Light Tobacco Smoker    Types: Cigars  . Smokeless tobacco: Never Used  . Tobacco comment: Tobacco info given 04/17/15  Substance and Sexual Activity  . Alcohol use: Yes    Alcohol/week: 2.0 standard drinks    Types: 2 Glasses of wine per week  . Drug use: No  . Sexual activity: Not on file

## 2020-01-05 ENCOUNTER — Encounter: Payer: Self-pay | Admitting: Internal Medicine

## 2020-06-07 ENCOUNTER — Encounter: Payer: Self-pay | Admitting: Internal Medicine

## 2020-06-07 NOTE — Patient Instructions (Signed)

## 2020-06-07 NOTE — Progress Notes (Signed)
Annual  Screening/Preventative Visit  & Comprehensive Evaluation & Examination      This very nice 64 y.o.  MWM  presents for a Screening /Preventative Visit & comprehensive evaluation and management of multiple medical co-morbidities.  Patient has been followed for HTN, HLD, Prediabetes and Vitamin D Deficiency.      Labile HTN predates circa 2003. Patient's BP has been controlled at home.  Today's BP  Is at goal - 122/84. Patient denies any cardiac symptoms as chest pain, palpitations, shortness of breath, dizziness or ankle swelling.     Patient's hyperlipidemia is not controlled with diet and rosuvastatin. Patient denies myalgias or other medication SE's. Last lipids were not at goal:  Lab Results  Component Value Date   CHOL 219 (H) 05/04/2019   HDL 52 05/04/2019   LDLCALC 128 (H) 05/04/2019   TRIG 251 (H) 05/04/2019   CHOLHDL 4.2 05/04/2019       Patient is monitored expectantly for glucose intolerance and patient denies reactive hypoglycemic symptoms, visual blurring, diabetic polys or paresthesias. Last A1c was Normal & at goal:  Lab Results  Component Value Date   HGBA1C 5.3 05/04/2019        Finally, patient has history of Vitamin D Deficiency and last vitamin D was not at goal (goal 70-100):  Lab Results  Component Value Date   VD25OH 44 05/04/2019    Current Outpatient Medications on File Prior to Visit  Medication Sig  . acyclovir (ZOVIRAX) 400 MG tablet TAKE 1 TABLET TWICE A DAY  . alfuzosin (UROXATRAL) 10 MG 24 hr tablet Take 1 tablet Daily for Prostate  . ANUCORT-HC 25 MG suppository INSERT ONE SUPPOSITORY RECTALLY THREE TIMES DAILY TO 4 TIMES DAILY  . Cholecalciferol (VITAMIN D) 50 MCG (2000 UT) tablet Take 2,000 Units by mouth daily.  . clotrimazole-betamethasone (LOTRISONE) cream Apply 1 application topically 2 (two) times daily.  . Eszopiclone 3 MG TABS TAKE 1 TABLET AT BEDTIME AS NEEDED  . Multiple Vitamin (MULTIVITAMIN) tablet Take 1 tablet by  mouth daily.  . Omega-3 Fatty Acids (FISH OIL PO) Take 1 capsule by mouth daily.  . rosuvastatin (CRESTOR) 20 MG tablet Take1 tablet daily for Cholesterol   No current facility-administered medications on file prior to visit.   Allergies  Allergen Reactions  . Sulfa Antibiotics     dizzy disoriented  . Sulfur Other (See Comments)    disoriented   Past Medical History:  Diagnosis Date  . Anal fissure 03/14/2016  . Colon polyp 04-28-2002   Colonoscopy   . HSV-2 infection   . Hyperlipemia   . Hypertension   . Insomnia   . Interstitial cystitis 03/05/2016  . Prolapsed internal hemorrhoids, grade 2 08/04/2012   Grade 2 hemorrhoids  05/11/15 band ligation right posterior hemorrhoidal bundle 06/14/15 band ligation right anterior hemorrhoidal bundle 07/13/15 LL banded   . Prostatitis    Health Maintenance  Topic Date Due  . COVID-19 Vaccine (1) Never done  . INFLUENZA VACCINE  04/30/2020  . COLONOSCOPY  08/04/2022  . TETANUS/TDAP  10/02/2027  . Hepatitis C Screening  Completed  . HIV Screening  Completed   Immunization History  Administered Date(s) Administered  . Influenza-Unspecified 06/30/2018  . PPD Test 03/31/2018, 05/04/2019, 06/08/2020  . Td 01/22/2007  . Tdap 10/01/2017  . Zoster Recombinat (Shingrix) 10/01/2017, 11/01/2017   Last Colon - 08/04/2012 - Dr Deatra Ina - Recc 5 yr f/u - due Nov 2018 & sent letter 02/2017 per Dr Ardis Hughs recc f/u Colonoscopy  Past Surgical History:  Procedure Laterality Date  . CALCANEAL OSTEOTOMY W/ INTERNAL FIXATION    . COLONOSCOPY  08/04/2012   Procedure: COLONOSCOPY;  Surgeon: Inda Castle, MD;  Location: [facility];  Service: Endoscopy;  Laterality: N/A;  with possible banding  . HEMORRHOID BANDING    . HEMORRHOID BANDING  2016   Family History  Problem Relation Age of Onset  . Cancer Mother   . Colon cancer Maternal Grandmother        dx in her late 24's   Social History   Socioeconomic History  . Marital status: Married     Spouse name: Eyvonne Mechanic  . Number of children: 2  Occupational History  . Occupation: Retired Aeronautical engineer  . Smoking status: Light Tobacco Smoker    Types: Cigars  . Smokeless tobacco: Never Used  . Tobacco comment: Tobacco info given 04/17/15  Substance and Sexual Activity  . Alcohol use: Yes    Alcohol/week: 2.0 standard drinks    Types: 2 Glasses of wine per week  . Drug use: No  . 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 Does c/o fatigue. Eyes: Denies redness, blurred vision, diplopia, discharge, itchy or watery eyes.  ENT: Denies discharge, congestion, post nasal drip, epistaxis, sore throat, earache, hearing loss, dental pain, Tinnitus, Vertigo, Sinus pain or snoring.  Cardio: Denies chest pain, palpitations, irregular heartbeat, syncope, dyspnea, diaphoresis, orthopnea, PND, claudication or edema Respiratory: denies cough, dyspnea, DOE, pleurisy, hoarseness, laryngitis or wheezing.  Gastrointestinal: Denies dysphagia, heartburn, reflux, water brash, pain, cramps, nausea, vomiting, bloating, diarrhea, constipation, hematemesis, melena, hematochezia, jaundice or hemorrhoids Genitourinary: Denies dysuria, frequency, urgency, nocturia, hesitancy, discharge, hematuria or flank pain Musculoskeletal: Denies arthralgia, myalgia, stiffness, Jt. Swelling, pain, limp or strain/sprain. Denies Falls. Skin: Denies puritis, rash, hives, warts, acne, eczema or change in skin lesion Neuro: No weakness, tremor, incoordination, spasms, paresthesia or pain Psychiatric: Denies confusion, memory loss or sensory loss. Denies Depression. Endocrine: Denies change in weight, skin, hair change, nocturia, and paresthesia, diabetic  polys, visual blurring or hyper / hypo glycemic episodes.  Heme/Lymph: No excessive bleeding, bruising or enlarged lymph nodes.  Physical Exam  BP 122/84   Pulse 60   Temp (!) 97 F (36.1 C)   Resp 16   Ht 5\' 11"  (1.803 m)   Wt 217 lb 3.2 oz (98.5 kg)   BMI 30.29 kg/m   General Appearance:  Over nourished and well groomed and in no apparent distress.  Eyes: PERRLA, EOMs, conjunctiva no swelling or erythema, normal fundi and vessels. Sinuses: No frontal/maxillary tenderness ENT/Mouth: EACs patent / TMs  nl. Nares clear without erythema, swelling, mucoid exudates. Oral hygiene is good. No erythema, swelling, or exudate. Tongue normal, non-obstructing. Tonsils not swollen or erythematous. Hearing normal.  Neck: Supple, thyroid not palpable. No bruits, nodes or JVD. Respiratory: Respiratory effort normal.  BS equal and clear bilateral without rales, rhonci, wheezing or stridor. Cardio: Heart sounds are normal with regular rate and rhythm and no murmurs, rubs or gallops. Peripheral pulses are normal and equal bilaterally without edema. No aortic or femoral bruits. Chest: symmetric with normal excursions and percussion.  Abdomen: Soft, with Nl bowel sounds. Nontender, no guarding, rebound, hernias, masses,  or organomegaly.  Lymphatics: Non tender without lymphadenopathy.  Musculoskeletal: Full ROM all peripheral extremities, joint stability, 5/5 strength, and normal gait. Skin: Warm and dry without rashes, lesions, cyanosis, clubbing or  ecchymosis.  Neuro: Cranial nerves intact, reflexes equal bilaterally. Normal muscle tone, no cerebellar symptoms. Sensation intact.  Pysch: Alert and oriented X 3 with normal affect, insight and judgment appropriate.   Assessment and Plan  1. Annual Preventative/Screening Exam    2. Labile hypertension  - EKG 12-Lead - [place], RETROPERITNL ABD,  LTD - Urinalysis, Routine w reflex microscopic - Microalbumin / creatinine urine ratio - CBC with  Differential/Platelet - COMPLETE METABOLIC PANEL WITH GFR - Magnesium - TSH  3. Hyperlipidemia, mixed  - EKG 12-Lead - [place], RETROPERITNL ABD,  LTD - Lipid panel - TSH  4. Abnormal glucose  - EKG 12-Lead - [place], RETROPERITNL ABD,  LTD - Hemoglobin A1c - Insulin, random  5. Vitamin D deficiency  - VITAMIN D 25 Hydroxy  6. BPH with obstruction/lower urinary tract symptoms  - PSA  7. Screening for colorectal cancer  - POC Hemoccult Bld/Stl   8. Prostate cancer screening  - PSA  9. Screening for ischemic heart disease  - EKG 12-Lead  10. Smoker  - EKG 12-Lead - [place], RETROPERITNL ABD,  LTD  11. Screening for AAA (aortic abdominal aneurysm)  - [place], RETROPERITNL ABD,  LTD  12. Fatigue  - Iron,Total/Total Iron Binding Cap - Vitamin B12 - Testosterone - CBC with Differential/Platelet - TSH  13. Medication management  - Urinalysis, Routine w reflex microscopic - Microalbumin / creatinine urine ratio - CBC with Differential/Platelet - COMPLETE METABOLIC PANEL WITH GFR - Magnesium - Lipid panel - TSH - Hemoglobin A1c - Insulin, random - VITAMIN D 25 Hydroxy   14. Screening-pulmonary TB  - TB Skin Test        Patient was counseled in prudent diet, weight control to achieve/maintain BMI less than 25, BP monitoring, regular exercise and medications as discussed.  Discussed med effects and SE's. Routine screening labs and tests as requested with regular follow-up as recommended. Over 40 minutes of exam, counseling, chart review and high complex critical decision making was performed   Kirtland Bouchard, MD

## 2020-06-08 ENCOUNTER — Encounter: Payer: Self-pay | Admitting: Internal Medicine

## 2020-06-08 ENCOUNTER — Other Ambulatory Visit: Payer: Self-pay

## 2020-06-08 ENCOUNTER — Ambulatory Visit (INDEPENDENT_AMBULATORY_CARE_PROVIDER_SITE_OTHER): Payer: BC Managed Care – PPO | Admitting: Internal Medicine

## 2020-06-08 VITALS — BP 122/84 | HR 60 | Temp 97.0°F | Resp 16 | Ht 71.0 in | Wt 217.2 lb

## 2020-06-08 DIAGNOSIS — Z0000 Encounter for general adult medical examination without abnormal findings: Secondary | ICD-10-CM | POA: Diagnosis not present

## 2020-06-08 DIAGNOSIS — Z130 Encounter for screening for diseases of the blood and blood-forming organs and certain disorders involving the immune mechanism: Secondary | ICD-10-CM

## 2020-06-08 DIAGNOSIS — E559 Vitamin D deficiency, unspecified: Secondary | ICD-10-CM | POA: Diagnosis not present

## 2020-06-08 DIAGNOSIS — Z1211 Encounter for screening for malignant neoplasm of colon: Secondary | ICD-10-CM

## 2020-06-08 DIAGNOSIS — Z1389 Encounter for screening for other disorder: Secondary | ICD-10-CM | POA: Diagnosis not present

## 2020-06-08 DIAGNOSIS — Z125 Encounter for screening for malignant neoplasm of prostate: Secondary | ICD-10-CM | POA: Diagnosis not present

## 2020-06-08 DIAGNOSIS — E782 Mixed hyperlipidemia: Secondary | ICD-10-CM

## 2020-06-08 DIAGNOSIS — Z79899 Other long term (current) drug therapy: Secondary | ICD-10-CM | POA: Diagnosis not present

## 2020-06-08 DIAGNOSIS — Z131 Encounter for screening for diabetes mellitus: Secondary | ICD-10-CM | POA: Diagnosis not present

## 2020-06-08 DIAGNOSIS — I10 Essential (primary) hypertension: Secondary | ICD-10-CM

## 2020-06-08 DIAGNOSIS — N401 Enlarged prostate with lower urinary tract symptoms: Secondary | ICD-10-CM | POA: Diagnosis not present

## 2020-06-08 DIAGNOSIS — Z136 Encounter for screening for cardiovascular disorders: Secondary | ICD-10-CM

## 2020-06-08 DIAGNOSIS — Z1329 Encounter for screening for other suspected endocrine disorder: Secondary | ICD-10-CM | POA: Diagnosis not present

## 2020-06-08 DIAGNOSIS — R0989 Other specified symptoms and signs involving the circulatory and respiratory systems: Secondary | ICD-10-CM

## 2020-06-08 DIAGNOSIS — R350 Frequency of micturition: Secondary | ICD-10-CM

## 2020-06-08 DIAGNOSIS — Z13220 Encounter for screening for lipoid disorders: Secondary | ICD-10-CM

## 2020-06-08 DIAGNOSIS — Z111 Encounter for screening for respiratory tuberculosis: Secondary | ICD-10-CM | POA: Diagnosis not present

## 2020-06-08 DIAGNOSIS — F17200 Nicotine dependence, unspecified, uncomplicated: Secondary | ICD-10-CM

## 2020-06-08 DIAGNOSIS — Z0001 Encounter for general adult medical examination with abnormal findings: Secondary | ICD-10-CM

## 2020-06-08 DIAGNOSIS — N138 Other obstructive and reflux uropathy: Secondary | ICD-10-CM

## 2020-06-08 DIAGNOSIS — R5383 Other fatigue: Secondary | ICD-10-CM

## 2020-06-08 DIAGNOSIS — R7309 Other abnormal glucose: Secondary | ICD-10-CM

## 2020-06-09 LAB — CBC WITH DIFFERENTIAL/PLATELET
Absolute Monocytes: 441 cells/uL (ref 200–950)
Basophils Absolute: 38 cells/uL (ref 0–200)
Basophils Relative: 0.6 %
Eosinophils Absolute: 82 cells/uL (ref 15–500)
Eosinophils Relative: 1.3 %
HCT: 42.7 % (ref 38.5–50.0)
Hemoglobin: 14.7 g/dL (ref 13.2–17.1)
Lymphs Abs: 1808 cells/uL (ref 850–3900)
MCH: 32.8 pg (ref 27.0–33.0)
MCHC: 34.4 g/dL (ref 32.0–36.0)
MCV: 95.3 fL (ref 80.0–100.0)
MPV: 11.2 fL (ref 7.5–12.5)
Monocytes Relative: 7 %
Neutro Abs: 3931 cells/uL (ref 1500–7800)
Neutrophils Relative %: 62.4 %
Platelets: 189 10*3/uL (ref 140–400)
RBC: 4.48 10*6/uL (ref 4.20–5.80)
RDW: 12.9 % (ref 11.0–15.0)
Total Lymphocyte: 28.7 %
WBC: 6.3 10*3/uL (ref 3.8–10.8)

## 2020-06-09 LAB — URINALYSIS, ROUTINE W REFLEX MICROSCOPIC
Bilirubin Urine: NEGATIVE
Glucose, UA: NEGATIVE
Hgb urine dipstick: NEGATIVE
Leukocytes,Ua: NEGATIVE
Nitrite: NEGATIVE
Protein, ur: NEGATIVE
Specific Gravity, Urine: 1.026 (ref 1.001–1.03)
pH: 5.5 (ref 5.0–8.0)

## 2020-06-09 LAB — TESTOSTERONE: Testosterone: 362 ng/dL (ref 250–827)

## 2020-06-09 LAB — LIPID PANEL
Cholesterol: 172 mg/dL (ref <200)
HDL: 46 mg/dL (ref >=40)
LDL Cholesterol (Calc): 101 mg/dL (calc) — ABNORMAL HIGH
Non-HDL Cholesterol (Calc): 126 mg/dL (calc) (ref <130)
Total CHOL/HDL Ratio: 3.7 (calc) (ref <5.0)
Triglycerides: 150 mg/dL — ABNORMAL HIGH (ref <150)

## 2020-06-09 LAB — IRON, TOTAL/TOTAL IRON BINDING CAP
%SAT: 27 % (calc) (ref 20–48)
Iron: 101 ug/dL (ref 50–180)
TIBC: 370 mcg/dL (calc) (ref 250–425)

## 2020-06-09 LAB — MICROALBUMIN / CREATININE URINE RATIO
Creatinine, Urine: 179 mg/dL (ref 20–320)
Microalb Creat Ratio: 3 mcg/mg creat (ref <30)
Microalb, Ur: 0.6 mg/dL

## 2020-06-09 LAB — COMPLETE METABOLIC PANEL WITH GFR
AG Ratio: 2.2 (calc) (ref 1.0–2.5)
ALT: 28 U/L (ref 9–46)
AST: 22 U/L (ref 10–35)
Albumin: 4.7 g/dL (ref 3.6–5.1)
Alkaline phosphatase (APISO): 63 U/L (ref 35–144)
BUN: 19 mg/dL (ref 7–25)
CO2: 27 mmol/L (ref 20–32)
Calcium: 9.5 mg/dL (ref 8.6–10.3)
Chloride: 104 mmol/L (ref 98–110)
Creat: 1.1 mg/dL (ref 0.70–1.25)
GFR, Est African American: 82 mL/min/{1.73_m2} (ref >=60)
GFR, Est Non African American: 71 mL/min/{1.73_m2} (ref >=60)
Globulin: 2.1 g/dL (calc) (ref 1.9–3.7)
Glucose, Bld: 93 mg/dL (ref 65–99)
Potassium: 4.4 mmol/L (ref 3.5–5.3)
Sodium: 139 mmol/L (ref 135–146)
Total Bilirubin: 0.8 mg/dL (ref 0.2–1.2)
Total Protein: 6.8 g/dL (ref 6.1–8.1)

## 2020-06-09 LAB — VITAMIN B12: Vitamin B-12: 677 pg/mL (ref 200–1100)

## 2020-06-09 LAB — HEMOGLOBIN A1C
Hgb A1c MFr Bld: 5.2 % of total Hgb (ref <5.7)
Mean Plasma Glucose: 103 (calc)
eAG (mmol/L): 5.7 (calc)

## 2020-06-09 LAB — VITAMIN D 25 HYDROXY (VIT D DEFICIENCY, FRACTURES): Vit D, 25-Hydroxy: 71 ng/mL (ref 30–100)

## 2020-06-09 LAB — PSA: PSA: 0.9 ng/mL (ref <=4.0)

## 2020-06-09 LAB — TSH: TSH: 1.43 mIU/L (ref 0.40–4.50)

## 2020-06-09 LAB — INSULIN, RANDOM: Insulin: 6.1 u[IU]/mL

## 2020-06-09 LAB — MAGNESIUM: Magnesium: 2.1 mg/dL (ref 1.5–2.5)

## 2020-06-09 NOTE — Progress Notes (Signed)
========================================================== -   Test results slightly outside the reference range are not unusual. If there is anything important, I will review this with you,  otherwise it is considered normal test values.  If you have further questions,  please do not hesitate to contact me at the office or via My Chart.  ==========================================================  -  Iron & Vitamin B12  levels are both Normal & OK  ==========================================================  -  PSA - Very Low - Great ! ==========================================================  -  Testosterone = 362 in the Low Normal Range - Ideal or Goal range is between 450 - 1,100   - So.................Marland Kitchen Recommend take Zinc 50 mg tablet Daily to help rase Testosterone levels Naturally ==========================================================  -  Total Chol = 172 -  Excellent   - Very low risk for Heart Attack  / Stroke =============================================================  - Trig's = 150 - also Great   - Bad LDL Chol = 101 - Borderline or slightly elevated  (Ideal or Goal is less than 70)  - - - - - - - - - - - - - - - - - - - - - - - - - - - - - - - - - - - - - - - - - - - - - - - - - - - -  - Suggest Keep the low dose of Rosuvastatin (Crestor) same &   work a little harder with diet - - - - - - - - - - - - - - - - - - - - - - - - - - - - - - - - - - - - - - - - - - - - - - - - - - - -  - Cholesterol only comes from animal sources  - ie. meat, dairy, egg yolks  - Eat all the vegetables you want.  - Avoid meat, especially red meat - Beef AND Pork .  - Avoid cheese & dairy - milk & ice cream.     - Cheese is the most concentrated form of trans-fats which  is the worst thing to clog up our arteries.   - Veggie cheese is OK which can be found in the fresh  produce section at [employer] or [employer] or  [employer] ==========================================================  -  A1c - Normal - Great  !   No Diabetes ==========================================================  -  Vitamin D = Excellent  ==========================================================  -  All Else - CBC - Kidneys - Electrolytes - Liver - Magnesium & Thyroid    - all  Normal / OK ====================================================   - Keep up the [place] Work ! ==========================================================

## 2020-07-07 ENCOUNTER — Other Ambulatory Visit: Payer: Self-pay | Admitting: Internal Medicine

## 2020-09-05 ENCOUNTER — Other Ambulatory Visit: Payer: Self-pay | Admitting: Internal Medicine

## 2020-09-05 DIAGNOSIS — E782 Mixed hyperlipidemia: Secondary | ICD-10-CM

## 2020-09-05 MED ORDER — ROSUVASTATIN CALCIUM 10 MG PO TABS: ORAL_TABLET | ORAL | 0 refills | Status: DC

## 2020-09-12 ENCOUNTER — Ambulatory Visit: Payer: BC Managed Care – PPO | Admitting: Adult Health

## 2020-09-16 ENCOUNTER — Other Ambulatory Visit: Payer: Self-pay | Admitting: Internal Medicine

## 2020-09-16 DIAGNOSIS — E782 Mixed hyperlipidemia: Secondary | ICD-10-CM

## 2020-09-16 MED ORDER — ROSUVASTATIN CALCIUM 10 MG PO TABS: ORAL_TABLET | ORAL | 0 refills | Status: DC

## 2020-09-21 ENCOUNTER — Other Ambulatory Visit: Payer: Self-pay | Admitting: Internal Medicine

## 2020-09-21 DIAGNOSIS — E782 Mixed hyperlipidemia: Secondary | ICD-10-CM

## 2020-09-21 MED ORDER — ROSUVASTATIN CALCIUM 20 MG PO TABS: ORAL_TABLET | ORAL | 0 refills | Status: DC

## 2020-10-27 ENCOUNTER — Other Ambulatory Visit: Payer: Self-pay | Admitting: Internal Medicine

## 2020-10-27 DIAGNOSIS — R0989 Other specified symptoms and signs involving the circulatory and respiratory systems: Secondary | ICD-10-CM

## 2020-10-27 MED ORDER — BISOPROLOL-HYDROCHLOROTHIAZIDE 5-6.25 MG PO TABS: ORAL_TABLET | ORAL | 0 refills | Status: DC

## 2020-11-13 ENCOUNTER — Other Ambulatory Visit: Payer: Self-pay

## 2020-11-13 ENCOUNTER — Encounter: Payer: Self-pay | Admitting: Adult Health Nurse Practitioner

## 2020-11-13 ENCOUNTER — Ambulatory Visit: Payer: BC Managed Care – PPO | Admitting: Adult Health Nurse Practitioner

## 2020-11-13 VITALS — BP 128/74 | HR 71 | Temp 97.6°F | Ht 71.0 in | Wt 221.0 lb

## 2020-11-13 DIAGNOSIS — Z79899 Other long term (current) drug therapy: Secondary | ICD-10-CM

## 2020-11-13 DIAGNOSIS — N401 Enlarged prostate with lower urinary tract symptoms: Secondary | ICD-10-CM | POA: Diagnosis not present

## 2020-11-13 DIAGNOSIS — E559 Vitamin D deficiency, unspecified: Secondary | ICD-10-CM | POA: Diagnosis not present

## 2020-11-13 DIAGNOSIS — R350 Frequency of micturition: Secondary | ICD-10-CM | POA: Diagnosis not present

## 2020-11-13 DIAGNOSIS — E782 Mixed hyperlipidemia: Secondary | ICD-10-CM

## 2020-11-13 DIAGNOSIS — R7309 Other abnormal glucose: Secondary | ICD-10-CM

## 2020-11-13 DIAGNOSIS — Z125 Encounter for screening for malignant neoplasm of prostate: Secondary | ICD-10-CM

## 2020-11-13 DIAGNOSIS — Z131 Encounter for screening for diabetes mellitus: Secondary | ICD-10-CM

## 2020-11-13 DIAGNOSIS — Z1329 Encounter for screening for other suspected endocrine disorder: Secondary | ICD-10-CM

## 2020-11-13 DIAGNOSIS — N138 Other obstructive and reflux uropathy: Secondary | ICD-10-CM

## 2020-11-13 DIAGNOSIS — Z0000 Encounter for general adult medical examination without abnormal findings: Secondary | ICD-10-CM | POA: Diagnosis not present

## 2020-11-13 DIAGNOSIS — Z1389 Encounter for screening for other disorder: Secondary | ICD-10-CM

## 2020-11-13 DIAGNOSIS — Z136 Encounter for screening for cardiovascular disorders: Secondary | ICD-10-CM | POA: Diagnosis not present

## 2020-11-13 DIAGNOSIS — Z130 Encounter for screening for diseases of the blood and blood-forming organs and certain disorders involving the immune mechanism: Secondary | ICD-10-CM

## 2020-11-13 DIAGNOSIS — R0989 Other specified symptoms and signs involving the circulatory and respiratory systems: Secondary | ICD-10-CM

## 2020-11-13 DIAGNOSIS — Z1321 Encounter for screening for nutritional disorder: Secondary | ICD-10-CM

## 2020-11-13 DIAGNOSIS — Z13220 Encounter for screening for lipoid disorders: Secondary | ICD-10-CM

## 2020-11-13 DIAGNOSIS — F17200 Nicotine dependence, unspecified, uncomplicated: Secondary | ICD-10-CM

## 2020-11-13 DIAGNOSIS — Z0001 Encounter for general adult medical examination with abnormal findings: Secondary | ICD-10-CM

## 2020-11-13 NOTE — Progress Notes (Signed)
Complete Physical   Assessment and Plan:  Jeremy Fowler was seen today for annual exam.  Diagnoses and all orders for this visit:  Encounter for general adult medical examination with abnormal findings Yearly -     CBC with Differential/Platelet -     COMPLETE METABOLIC PANEL WITH GFR -     TSH -     Magnesium  Labile hypertension No medications at this time Monitor blood pressure at home; call if consistently over 130/80 Continue DASH diet.   Reminder to go to the ER if any CP, SOB, nausea, dizziness, severe HA, changes vision/speech, left arm numbness and tingling and jaw pain. -     CBC with Differential/Platelet -     COMPLETE METABOLIC PANEL WITH GFR -     TSH -     Magnesium  Hyperlipidemia, mixed Continue medications: Rosuvastatin 20mg  Discussed dietary and exercise modifications Low fat diet -     Lipid panel  Abnormal glucose Discussed dietary and exercise modifications -     Hemoglobin A1c  Vitamin D deficiency Continue supplementation to maintain goal of 70-100 Taking Vitamin D taking multivitamin daily -     VITAMIN D 25 Hydroxy (Vit-D Deficiency, Fractures)  BPH with obstruction/lower urinary tract symptoms Doing well at this time Continue medications: Alfuzosin 10mg  Will continue to monitor -     PSA  Smoker Cigars, intermittent  Screening, ischemic heart disease -     EKG 12-Lead  Screening for thyroid disorder -TSH Screening PSA (prostate specific antigen) -     PSA  Encounter for vitamin deficiency screening -     Vitamin B12  Screening for blood or protein in urine -     Urinalysis w microscopic + reflex cultur  Screening, iron deficiency anemia -     Iron,Total/Total Iron Binding Cap     Continue diet and meds as discussed. Further disposition pending results of labs. Discussed med's effects and SE's.  Patient agrees with plan of care and opportunity to ask questions/voice concerns. Over 30 minutes of chart review, interview, exam,  counseling, and critical decision making was performed.   Future Appointments  Date Time Provider [place]  12/21/2020  9:30 AM Unk Pinto, MD [facility] None  11/13/2021 10:00 AM Garnet Sierras, NP [facility] None    ----------------------------------------------------------------------------------------------------------------------  HPI 65 y.o. male  presents for 3 month follow up on HTN, HLD, abnormal glucose, weight and vitamin D deficiency.  He donates blood and platelets routinely.  Reports last  Reports in the past month his blood pressure has been ranging 165-204 over 80's-70's.  He has been checking at the gym, pharmacy and then purchased his own B/P cuff and ranging 114-150's over 70-80's.  Pulse ranging from 60-80's.  He is asymptomatic.  Reports he is not sitting prior to checking his blood pressure.  After review it appears he has had labile HTN in the past but not treated.  Just recently he message Dr Melford Aase regarding B/P elevations and bisolprolol/hctz 5/6.25mg  was sent in for the patient  He picked this up but did not take any of the medication.  Discussed goal for him and provided education sheet to record B/P.    He does workout regularly at the [facility].  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  Otherwise he does not have any health or medication concerns.    BMI is Body mass index is 30.82 kg/m., he has been working on diet and exercise.  Wt Readings from Last 3 Encounters:  11/13/20 221 lb (100.2 kg)  06/08/20 217 lb 3.2 oz (98.5 kg)  10/12/19 216 lb 8 oz (98.2 kg)    HTN predates 2003 His blood pressure has not been controlled at home, today their BP is BP: 128/74  He does workout. He denies any cardiac symptoms, chest pains, palpitations, shortness of breath, dizziness or lower extremity edema.      He is on cholesterol medication Rosuvastatin 20mg  daily and denies myalgias. His cholesterol is not at  goal. The cholesterol last visit was:   Lab Results  Component Value Date   CHOL 172 06/08/2020   HDL 46 06/08/2020   LDLCALC 101 (H) 06/08/2020   TRIG 150 (H) 06/08/2020   CHOLHDL 3.7 06/08/2020    He has been working on diet and exercise for prediabetes, and denies polydipsia, polyuria, visual disturbances, vomiting and weight loss. Last A1C in the office was:  Lab Results  Component Value Date   HGBA1C 5.2 06/08/2020   Patient is on Vitamin D supplement.   Lab Results  Component Value Date   VD25OH 71 06/08/2020       Current Medications:  Current Outpatient Medications on File Prior to Visit  Medication Sig  . acyclovir (ZOVIRAX) 400 MG tablet TAKE 1 TABLET TWICE A DAY  . alfuzosin (UROXATRAL) 10 MG 24 hr tablet Take 1 tablet Daily for Prostate  . ANUCORT-HC 25 MG suppository INSERT ONE SUPPOSITORY RECTALLY THREE TIMES DAILY TO 4 TIMES DAILY  . bisoprolol-hydrochlorothiazide (ZIAC) 5-6.25 MG tablet Take  1 tablet  every Morning for BP  . Cholecalciferol (VITAMIN D) 50 MCG (2000 UT) tablet Take 2,000 Units by mouth daily.  . clotrimazole-betamethasone (LOTRISONE) cream Apply 1 application topically 2 (two) times daily.  . Eszopiclone 3 MG TABS TAKE 1 TABLET BY MOUTH EVERY DAY AT BEDTIME AS NEEDED  . Multiple Vitamin (MULTIVITAMIN) tablet Take 1 tablet by mouth daily.  . Omega-3 Fatty Acids (FISH OIL PO) Take 1 capsule by mouth daily.  . rosuvastatin (CRESTOR) 20 MG tablet Take     1 tablet       Daily        for Cholesterol   No current facility-administered medications on file prior to visit.    Allergies:  Allergies  Allergen Reactions  . Sulfa Antibiotics     dizzy disoriented  . Elemental Sulfur Other (See Comments)    disoriented     Medical History:  Past Medical History:  Diagnosis Date  . Anal fissure 03/14/2016  . Colon polyp 04-28-2002   Colonoscopy   . HSV-2 infection   . Hyperlipemia   . Hypertension   . Insomnia   . Interstitial cystitis  03/05/2016  . Prolapsed internal hemorrhoids, grade 2 08/04/2012   Grade 2 hemorrhoids  05/11/15 band ligation right posterior hemorrhoidal bundle 06/14/15 band ligation right anterior hemorrhoidal bundle 07/13/15 LL banded   . Prostatitis     Family history- Reviewed and unchanged   Social history- Reviewed and unchanged   Names of Other Physician/Practitioners you currently use: 1. [facility] here for primary care 2. Eye Exam: Due for 2022 3. Dental Exam :Q36months  Patient Care Team: Unk Pinto, MD as PCP - General (Internal Medicine) Marybelle Killings, MD as Consulting Physician (Orthopedic Surgery) Crista Luria, MD as Consulting Physician (Dermatology) Marlou Sa Tonna Corner, MD as Consulting Physician (Orthopedic Surgery)   Screening Tests: Immunization History  Administered Date(s) Administered  . Influenza-Unspecified  06/30/2018  . Moderna Sars-Covid-2 Vaccination 12/16/2019, 01/14/2020, 08/08/2020  . PPD Test 03/31/2018, 05/04/2019, 06/08/2020  . Td 01/22/2007  . Tdap 10/01/2017  . Zoster Recombinat (Shingrix) 10/01/2017, 11/01/2017     Vaccinations: TD or Tdap: 2019  Influenza: 2019, declined  Pneumococcal: Discussed next year Shingrix: 2019, compelete   Preventative Care: Last colonoscopy: 2013, due next year Hep C screening (5701-7793): 03/2015 PSA Screening : today   Review of Systems:  Review of Systems  Constitutional: Negative for chills, diaphoresis, fever, malaise/fatigue and weight loss.  HENT: Negative for congestion, ear discharge, ear pain, hearing loss, nosebleeds, sinus pain, sore throat and tinnitus.   Eyes: Negative for blurred vision, double vision, photophobia, pain, discharge and redness.  Respiratory: Negative for cough, hemoptysis, sputum production, shortness of breath, wheezing and stridor.   Cardiovascular: Negative for chest pain, palpitations, orthopnea, claudication, leg swelling and PND.   Gastrointestinal: Negative for abdominal pain, blood in stool, constipation, diarrhea, heartburn, melena, nausea and vomiting.  Genitourinary: Negative for dysuria, flank pain, frequency, hematuria and urgency.  Musculoskeletal: Negative for back pain, falls, joint pain, myalgias and neck pain.  Skin: Negative for itching and rash.  Neurological: Negative for dizziness, tingling, tremors, sensory change, speech change, focal weakness, seizures, loss of consciousness, weakness and headaches.  Endo/Heme/Allergies: Negative for environmental allergies and polydipsia. Does not bruise/bleed easily.  Psychiatric/Behavioral: Negative for depression, hallucinations, memory loss, substance abuse and suicidal ideas. The patient is not nervous/anxious and does not have insomnia.       Physical Exam: BP 128/74   Pulse 71   Temp 97.6 F (36.4 C)   Ht 5\' 11"  (1.803 m)   Wt 221 lb (100.2 kg)   SpO2 98%   BMI 30.82 kg/m  Wt Readings from Last 3 Encounters:  11/13/20 221 lb (100.2 kg)  06/08/20 217 lb 3.2 oz (98.5 kg)  10/12/19 216 lb 8 oz (98.2 kg)   General Appearance: Well nourished, in no apparent distress. Eyes: PERRLA, EOMs, conjunctiva no swelling or erythema Sinuses: No Frontal/maxillary tenderness ENT/Mouth: Ext aud canals clear, TMs without erythema, bulging. No erythema, swelling, or exudate on post pharynx.  Tonsils not swollen or erythematous. Hearing normal.  Neck: Supple, thyroid normal.  Respiratory: Respiratory effort normal, BS equal bilaterally without rales, rhonchi, wheezing or stridor.  Cardio: RRR with no MRGs. Brisk peripheral pulses without edema.  Abdomen: Soft, + BS.  Non tender, no guarding, rebound, hernias, masses. Lymphatics: Non tender without lymphadenopathy.  Musculoskeletal: Full ROM, 5/5 strength, Normal gait Skin: Warm, dry without rashes, lesions, ecchymosis.  Neuro: Cranial nerves intact. No cerebellar symptoms.  Psych: Awake and oriented X 3, normal  affect, Insight and Judgment appropriate.   EKG: AV block, no ST changes Reviewed with patient   Bayard Males, DNP [facility] 11/13/2020  10:29 AM

## 2020-11-15 LAB — URINALYSIS W MICROSCOPIC + REFLEX CULTURE
Bacteria, UA: NONE SEEN /HPF
Bilirubin Urine: NEGATIVE
Glucose, UA: NEGATIVE
Hgb urine dipstick: NEGATIVE
Hyaline Cast: NONE SEEN /LPF
Ketones, ur: NEGATIVE
Nitrites, Initial: NEGATIVE
Protein, ur: NEGATIVE
RBC / HPF: NONE SEEN /HPF (ref 0–2)
Specific Gravity, Urine: 1.007 (ref 1.001–1.03)
Squamous Epithelial / HPF: NONE SEEN /HPF (ref <=5)
pH: 6 (ref 5.0–8.0)

## 2020-11-15 LAB — LIPID PANEL
Cholesterol: 205 mg/dL — ABNORMAL HIGH (ref <200)
HDL: 56 mg/dL (ref >=40)
LDL Cholesterol (Calc): 118 mg/dL (calc) — ABNORMAL HIGH
Non-HDL Cholesterol (Calc): 149 mg/dL (calc) — ABNORMAL HIGH (ref <130)
Total CHOL/HDL Ratio: 3.7 (calc) (ref <5.0)
Triglycerides: 190 mg/dL — ABNORMAL HIGH (ref <150)

## 2020-11-15 LAB — COMPLETE METABOLIC PANEL WITH GFR
AG Ratio: 2 (calc) (ref 1.0–2.5)
ALT: 32 U/L (ref 9–46)
AST: 26 U/L (ref 10–35)
Albumin: 5.1 g/dL (ref 3.6–5.1)
Alkaline phosphatase (APISO): 70 U/L (ref 35–144)
BUN: 18 mg/dL (ref 7–25)
CO2: 29 mmol/L (ref 20–32)
Calcium: 10.2 mg/dL (ref 8.6–10.3)
Chloride: 103 mmol/L (ref 98–110)
Creat: 1.07 mg/dL (ref 0.70–1.25)
GFR, Est African American: 85 mL/min/{1.73_m2} (ref >=60)
GFR, Est Non African American: 73 mL/min/{1.73_m2} (ref >=60)
Globulin: 2.5 g/dL (calc) (ref 1.9–3.7)
Glucose, Bld: 86 mg/dL (ref 65–99)
Potassium: 4.7 mmol/L (ref 3.5–5.3)
Sodium: 140 mmol/L (ref 135–146)
Total Bilirubin: 0.7 mg/dL (ref 0.2–1.2)
Total Protein: 7.6 g/dL (ref 6.1–8.1)

## 2020-11-15 LAB — URINE CULTURE
MICRO NUMBER:: 11533101
Result:: NO GROWTH
SPECIMEN QUALITY:: ADEQUATE

## 2020-11-15 LAB — IRON, TOTAL/TOTAL IRON BINDING CAP
%SAT: 21 % (calc) (ref 20–48)
Iron: 79 ug/dL (ref 50–180)
TIBC: 381 mcg/dL (calc) (ref 250–425)

## 2020-11-15 LAB — CBC WITH DIFFERENTIAL/PLATELET
Absolute Monocytes: 481 cells/uL (ref 200–950)
Basophils Absolute: 52 cells/uL (ref 0–200)
Basophils Relative: 0.9 %
Eosinophils Absolute: 81 cells/uL (ref 15–500)
Eosinophils Relative: 1.4 %
HCT: 48 % (ref 38.5–50.0)
Hemoglobin: 16.3 g/dL (ref 13.2–17.1)
Lymphs Abs: 1757 cells/uL (ref 850–3900)
MCH: 33.4 pg — ABNORMAL HIGH (ref 27.0–33.0)
MCHC: 34 g/dL (ref 32.0–36.0)
MCV: 98.4 fL (ref 80.0–100.0)
MPV: 11 fL (ref 7.5–12.5)
Monocytes Relative: 8.3 %
Neutro Abs: 3428 cells/uL (ref 1500–7800)
Neutrophils Relative %: 59.1 %
Platelets: 174 10*3/uL (ref 140–400)
RBC: 4.88 10*6/uL (ref 4.20–5.80)
RDW: 12.8 % (ref 11.0–15.0)
Total Lymphocyte: 30.3 %
WBC: 5.8 10*3/uL (ref 3.8–10.8)

## 2020-11-15 LAB — MAGNESIUM: Magnesium: 2.3 mg/dL (ref 1.5–2.5)

## 2020-11-15 LAB — VITAMIN B12: Vitamin B-12: 550 pg/mL (ref 200–1100)

## 2020-11-15 LAB — HEMOGLOBIN A1C
Hgb A1c MFr Bld: 5.2 % of total Hgb (ref <5.7)
Mean Plasma Glucose: 103 mg/dL
eAG (mmol/L): 5.7 mmol/L

## 2020-11-15 LAB — CULTURE INDICATED

## 2020-11-15 LAB — VITAMIN D 25 HYDROXY (VIT D DEFICIENCY, FRACTURES): Vit D, 25-Hydroxy: 55 ng/mL (ref 30–100)

## 2020-11-15 LAB — TSH: TSH: 2.12 mIU/L (ref 0.40–4.50)

## 2020-11-15 LAB — PSA: PSA: 1.4 ng/mL (ref <=4.0)

## 2020-11-21 MED ORDER — ALFUZOSIN HCL ER 10 MG PO TB24: ORAL_TABLET | ORAL | 3 refills | Status: DC

## 2020-12-21 ENCOUNTER — Ambulatory Visit: Payer: BC Managed Care – PPO | Admitting: Internal Medicine

## 2021-03-23 ENCOUNTER — Other Ambulatory Visit: Payer: Self-pay | Admitting: Internal Medicine

## 2021-03-23 NOTE — Telephone Encounter (Signed)
Called patient and he states with the summer heat, his perianal dermatitis has flared-up

## 2021-05-24 ENCOUNTER — Ambulatory Visit: Payer: BC Managed Care – PPO | Admitting: Internal Medicine

## 2021-06-13 ENCOUNTER — Encounter: Payer: BC Managed Care – PPO | Admitting: Internal Medicine

## 2021-07-13 ENCOUNTER — Other Ambulatory Visit: Payer: Self-pay | Admitting: Internal Medicine

## 2021-07-13 MED ORDER — ACYCLOVIR 400 MG PO TABS: ORAL_TABLET | ORAL | 3 refills | Status: DC

## 2021-08-06 ENCOUNTER — Ambulatory Visit: Payer: Medicare Other | Admitting: Surgical

## 2021-08-06 ENCOUNTER — Encounter: Payer: Self-pay | Admitting: Orthopedic Surgery

## 2021-08-06 ENCOUNTER — Ambulatory Visit: Payer: Self-pay

## 2021-08-06 ENCOUNTER — Other Ambulatory Visit: Payer: Self-pay

## 2021-08-06 DIAGNOSIS — M7711 Lateral epicondylitis, right elbow: Secondary | ICD-10-CM

## 2021-08-06 NOTE — Progress Notes (Addendum)
Office Visit Note   Patient: Jeremy Fowler           Date of Birth: 1955/10/30           MRN: [id number] Visit Date: 08/06/2021 Requested by: Unk Pinto, [employer] [place],  [place] [address] PCP: Unk Pinto, MD  Subjective: Chief Complaint  Patient presents with   Right Elbow - Follow-up    HPI: Jeremy Fowler is a 65 y.o. male who presents to the office complaining of right elbow pain.  Patient states that he had a [place] injury about a year ago with subsequent radiographs that were negative.  He noticed mild swelling and bruising at the time but after several months his elbow return to baseline.  Now over the last 2 months he has had increased pain in the right elbow that he localizes to the lateral and dorsal aspect of the elbow.  He has no history of surgery to the elbow but he does have history of left elbow tricep repair in 2020.  Left elbow MRI from 2020 demonstrated tricep tear partial tear with mild tendinosis of the extensor tendon.  He complains of difficulty due to pain with flexion and extension of the elbow as well as heavy grip strength.  He has had to discontinue the arm workouts he does in the gym due to this pain.  He is taking Advil and using ice.  Denies any numbness or tingling that is new for him.  He has history of calcaneus fracture and so most of his workouts can assist of cardiovascular exercise with elliptical and occasional rowing..                ROS: All systems reviewed are negative as they relate to the chief complaint within the history of present illness.  Patient denies fevers or chills.  Assessment & Plan: Visit Diagnoses:  1. Lateral epicondylitis, right elbow     Plan: Patient is a 65 year old male who presents for evaluation of right elbow pain.  Has had increasing pain in the right elbow over the last 2 months without any inciting event he can point to.  Based on exam and history, impression is right elbow lateral  epicondylitis with more mild medial epicondylitis.  Discussed options available to patient.  Plan to set him up for physical therapy upstairs to design a home exercise program.  He will continue with cardiovascular exercise in order to optimize blood flow to the lateral epicondyle.  Avoid aggravating exercises.  Follow-up in 6 weeks for clinical recheck.  If no improvement, consider formal therapy to attend regularly versus MRI based on his history of tricep tear on the other side.  Not really any injury for this onset of pain that he can point to though..  If no improvement after that could consider leukocyte rich PRP injection under ultrasound guidance.  Follow-Up Instructions: No follow-ups on file.   Orders:  Orders Placed This Encounter  Procedures   XR Elbow Complete Right (3+View)   Ambulatory referral to Physical Therapy   No orders of the defined types were placed in this encounter.     Procedures: No procedures performed   Clinical Data: No additional findings.  Objective: Vital Signs: There were no vitals taken for this visit.  Physical Exam:  Constitutional: Patient appears well-developed HEENT:  Head: Normocephalic Eyes:EOM are normal Neck: Normal range of motion Cardiovascular: Normal rate Pulmonary/chest: Effort normal Neurologic: Patient is alert Skin: Skin is warm  Psychiatric: Patient has normal mood and affect  Ortho Exam: Ortho exam demonstrates tenderness over the medial lateral epicondyle of the right elbow, more so over the lateral epicondyle.  Increased pain over the lateral epicondyle with grip strength testing and resisted wrist extension.  Increased pain over the medial epicondyle with resisted wrist flexion.  No pain with resisted elbow flexion or extension.  No tenderness over the tricep tendon or the bicep tendon.  Bicep tendon is palpable and intact.  No ecchymosis noted.  Full active and passive range of motion of the right elbow from 0 to 120  degrees.  Specialty Comments:  No specialty comments available.  Imaging: No results found.   PMFS History: Patient Active Problem List   Diagnosis Date Noted   Perianal dermatitis 09/16/2019   Left arm pain 02/09/2019   Interstitial cystitis 03/05/2016   History of basal cell carcinoma 03/17/2015   Vitamin D deficiency 03/17/2015   Medication management 03/17/2015   Tobacco use disorder 03/17/2015   Hyperlipemia    Hypertension    Insomnia    HSV-2 infection    Prolapsed internal hemorrhoids, grade 2 08/04/2012   BPH (benign prostatic hyperplasia) 07/29/2012   Hx of colonic polyps 07/29/2012   Family hx of colon cancer to grandparents 07/29/2012   Past Medical History:  Diagnosis Date   Anal fissure 03/14/2016   Colon polyp 04-28-2002   Colonoscopy    HSV-2 infection    Hyperlipemia    Hypertension    Insomnia    Interstitial cystitis 03/05/2016   Prolapsed internal hemorrhoids, grade 2 08/04/2012   Grade 2 hemorrhoids  05/11/15 band ligation right posterior hemorrhoidal bundle 06/14/15 band ligation right anterior hemorrhoidal bundle 07/13/15 LL banded    Prostatitis     Family History  Problem Relation Age of Onset   Cancer Mother    Colon cancer Maternal Grandmother        dx in her late 92's    Past Surgical History:  Procedure Laterality Date   CALCANEAL OSTEOTOMY W/ INTERNAL FIXATION     COLONOSCOPY  08/04/2012   Procedure: COLONOSCOPY;  Surgeon: Inda Castle, MD;  Location: [facility];  Service: Endoscopy;  Laterality: N/A;  with possible banding   HEMORRHOID BANDING     HEMORRHOID BANDING  2016   Social History   Occupational History   Occupation: Insurance underwriter    Comment: Retired  Tobacco Use   Smoking status: Light Smoker    Types: Cigars   Smokeless tobacco: Never   Tobacco comments:    Tobacco info given 04/17/15  Substance and Sexual Activity   Alcohol use: Yes    Alcohol/week: 2.0 standard drinks    Types: 2 Glasses of wine per week   Drug  use: No   Sexual activity: Not on file

## 2021-08-15 ENCOUNTER — Other Ambulatory Visit: Payer: Self-pay

## 2021-08-15 ENCOUNTER — Encounter: Payer: Self-pay | Admitting: Physical Therapy

## 2021-08-15 ENCOUNTER — Ambulatory Visit: Payer: Medicare Other | Admitting: Physical Therapy

## 2021-08-15 DIAGNOSIS — M25521 Pain in right elbow: Secondary | ICD-10-CM | POA: Diagnosis not present

## 2021-08-15 DIAGNOSIS — M25621 Stiffness of right elbow, not elsewhere classified: Secondary | ICD-10-CM

## 2021-08-15 NOTE — Therapy (Signed)
[facility] Physical Therapy [address] [place], [place], [address] Phone: [phone]   Fax:  [phone]  Physical Therapy Evaluation  Patient Details  Name: Jeremy Fowler MRN: [id number] Date of Birth: 1955-11-10 Referring Provider (PT): Donella Stade PA-C   Encounter Date: 08/15/2021   PT End of Session - 08/15/21 0857     Visit Number 1    Number of Visits 6    Date for PT Re-Evaluation 09/28/21    PT Start Time 0845    PT Stop Time 0925    PT Time Calculation (min) 40 min    Activity Tolerance Patient tolerated treatment well    Behavior During Therapy [facility] for tasks assessed/performed             Past Medical History:  Diagnosis Date   Anal fissure 03/14/2016   Colon polyp 04-28-2002   Colonoscopy    HSV-2 infection    Hyperlipemia    Hypertension    Insomnia    Interstitial cystitis 03/05/2016   Prolapsed internal hemorrhoids, grade 2 08/04/2012   Grade 2 hemorrhoids  05/11/15 band ligation right posterior hemorrhoidal bundle 06/14/15 band ligation right anterior hemorrhoidal bundle 07/13/15 LL banded    Prostatitis     Past Surgical History:  Procedure Laterality Date   CALCANEAL OSTEOTOMY W/ INTERNAL FIXATION     COLONOSCOPY  08/04/2012   Procedure: COLONOSCOPY;  Surgeon: Inda Castle, MD;  Location: [facility];  Service: Endoscopy;  Laterality: N/A;  with possible banding   HEMORRHOID BANDING     HEMORRHOID BANDING  2016    There were no vitals filed for this visit.    Subjective Assessment - 08/15/21 0850     Subjective Pt arriving to therapy reporting right elbow pain which has been ongoing for several months. Pt dx with lateral epicondylitis. Pt reporting pain with reaching and rotation.    Pertinent History HTN, hyperlipidemia, colon polyps, anal fissure, HSV-2 infection, insomnia    Limitations Lifting;Other (comment)    Patient Stated Goals Stop hurting, get back to using ellipitical    Currently in Pain? Yes     Pain Score 2     Pain Location Elbow    Pain Orientation Right    Pain Descriptors / Indicators Aching    Pain Type Acute pain    Pain Onset More than a month ago    Pain Frequency Constant    Aggravating Factors  supination, lifting    Pain Relieving Factors over the counter pain meds, some stretches    Effect of Pain on Daily Activities difficulty lifting                [facility] PT Assessment - 08/15/21 0001       Assessment   Medical Diagnosis M77.11 lateral epicondylitis of right elbow    Referring Provider (PT) Gerrianne Scale magnant PA-C    Hand Dominance Right    Prior Therapy yes after tricep surgery      Precautions   Precautions None      Restrictions   Weight Bearing Restrictions No      Balance Screen   Has the patient fallen in the past 6 months No    Is the patient reluctant to leave their home because of a fear of falling?  No      Home Ecologist residence      Prior Function   Level of Independence Independent    Vocation Retired  Cognition   Overall Cognitive Status Within Functional Limits for tasks assessed      Observation/Other Assessments   Focus on Therapeutic Outcomes (FOTO)  72%, (predicted 80%)      Posture/Postural Control   Posture/Postural Control Postural limitations    Postural Limitations Rounded Shoulders;Forward head      ROM / Strength   AROM / PROM / Strength AROM;Strength      AROM   AROM Assessment Site Elbow;Wrist    Right/Left Elbow Right;Left    Right Elbow Flexion 140    Right Elbow Extension 0    Left Elbow Flexion 144    Left Elbow Extension 0    Right/Left Wrist Left    Right Wrist Extension 40 Degrees    Right Wrist Flexion 60 Degrees    Right Wrist Radial Deviation 20 Degrees    Right Wrist Ulnar Deviation 28 Degrees    Left Wrist Extension 44 Degrees    Left Wrist Flexion 65 Degrees    Left Wrist Radial Deviation 32 Degrees    Left Wrist Ulnar Deviation 34 Degrees       Strength   Overall Strength Comments L grip: 111ppsi, R grip: 106ppsi    Strength Assessment Site Elbow;Wrist    Right/Left Elbow Right;Left    Right Elbow Flexion 4+/5    Right Elbow Extension 4+/5    Left Elbow Flexion 5/5    Left Elbow Extension 5/5    Right/Left Wrist Right;Left    Right Wrist Flexion 4+/5    Right Wrist Extension 4+/5    Right Wrist Radial Deviation 4+/5    Right Wrist Ulnar Deviation 4+/5    Left Wrist Flexion 5/5    Left Wrist Extension 5/5    Left Wrist Radial Deviation 5/5    Left Wrist Ulnar Deviation 5/5      Palpation   Palpation comment TTP: extensor carpi radialis longus and brevis tendon's and supinator of right UE                        Objective measurements completed on examination: See above findings.                PT Education - 08/15/21 0856     Education Details PT POC, HEP, Discussed Iontophoresis and Dry Needling, DN handout issued to pt    Person(s) Educated Patient    Methods Explanation;Demonstration;Tactile cues;Verbal cues;Handout    Comprehension Verbalized understanding;Returned demonstration              PT Short Term Goals - 08/15/21 0959       PT SHORT TERM GOAL #1   Title pt will be independent in his initial HEP.    Time 2    Status New    Target Date 09/07/21               PT Long Term Goals - 08/15/21 0959       PT LONG TERM GOAL #1   Title Pt will be independent in advanced HEP.    Time 6    Period Weeks    Status New    Target Date 09/28/21      PT LONG TERM GOAL #2   Title Pt will improve his right grip strength to >/= 115 ppsi.    Baseline 106 ppsi    Time 6    Period Weeks    Status New    Target Date 09/28/21  PT LONG TERM GOAL #3   Title Pt will be able to lift 20# from floor and carry 40 feet across room with pain </= 2/10 in right elbow.    Time 6    Period Weeks    Status New    Target Date 09/28/21      PT LONG TERM GOAL #4   Title Pt will  improve his FOTO to >/= 80 %    Baseline 72%    Time 6    Period Weeks    Status New    Target Date 09/28/21                    Plan - 08/15/21 0935     Clinical Impression Statement Pt arriving today for PT evaluation of lateral epicondylitis of right elbow. Pt reporting varying pain with certain activities especially lifting with extension and flexion. Pt presenting with mild weakness noted in right grip and elbow/wrist strength with and with AROM. Pt was edu in both Iontophoresis and DN for possible treatments at his next session and DN handout issued. Pt was edu in stretching and soft tissue massage. Skilled PT needed to address pt's impairments with the below interventions.    Personal Factors and Comorbidities Comorbidity 2    Comorbidities HTN, hyperlipidemia, colon polyps, anal fissure, HSV-2 infection, insomnia    Examination-Activity Limitations Other;Lift    Examination-Participation Restrictions Other;Yard Work    Stability/Clinical Decision Making Stable/Uncomplicated    Clinical Decision Making Low    Rehab Potential Good    PT Frequency 1x / week    PT Duration 6 weeks    PT Treatment/Interventions Joint Manipulations;Cryotherapy;Electrical Stimulation;Iontophoresis 4mg /ml Dexamethasone;Moist Heat;Ultrasound;Functional mobility training;Therapeutic activities;Therapeutic exercise;Balance training;Neuromuscular re-education;Patient/family education;Dry needling;Passive range of motion;Manual techniques;Taping    PT Next Visit Plan Consider DN extensors, possible Ionto, stretching and strengthening exericises    PT Home Exercise Plan Access Code: [email or web address]  URL: [email or web address]  Date: 08/15/2021  Prepared by: Kearney Hard    Exercises  Standing Wrist Flexion Stretch - 2-3 x daily - 7 x weekly - 5 reps - 20 seconds hold  Standing Supinator Stretch - 2-3 x daily - 7 x weekly - 3-5 reps - 10 seconds hold  Standing Wrist Flexion Stretch - 2-3 x daily -  7 x weekly - 3-5 reps  Tennis Elbow Self Massage - 2-3 x daily - 7 x weekly    Consulted and Agree with Plan of Care Patient             Patient will benefit from skilled therapeutic intervention in order to improve the following deficits and impairments:  Increased edema, Decreased range of motion, Decreased activity tolerance, Impaired UE functional use, Postural dysfunction, Pain  Visit Diagnosis: Pain in right elbow  Stiffness of right elbow, not elsewhere classified     Problem List Patient Active Problem List   Diagnosis Date Noted   Perianal dermatitis 09/16/2019   Left arm pain 02/09/2019   Interstitial cystitis 03/05/2016   History of basal cell carcinoma 03/17/2015   Vitamin D deficiency 03/17/2015   Medication management 03/17/2015   Tobacco use disorder 03/17/2015   Hyperlipemia    Hypertension    Insomnia    HSV-2 infection    Prolapsed internal hemorrhoids, grade 2 08/04/2012   BPH (benign prostatic hyperplasia) 07/29/2012   Hx of colonic polyps 07/29/2012   Family hx of colon cancer to grandparents 07/29/2012    Oretha Caprice, PT,MPT 08/15/2021, 12:13 PM  [facility] Physical Therapy [address] [place], [place], [address] Phone: [phone]   Fax:  [phone]  Name: Jeremy Fowler MRN: [id number] Date of Birth: 01-06-1956

## 2021-08-15 NOTE — Patient Instructions (Signed)
Access Code: [email or web address] URL: [email or web address] Date: 08/15/2021 Prepared by: Kearney Hard  Exercises Standing Wrist Flexion Stretch - 2-3 x daily - 7 x weekly - 5 reps - 20 seconds hold Standing Supinator Stretch - 2-3 x daily - 7 x weekly - 3-5 reps - 10 seconds hold Standing Wrist Flexion Stretch - 2-3 x daily - 7 x weekly - 3-5 reps Tennis Elbow Self Massage - 2-3 x daily - 7 x weekly

## 2021-08-22 ENCOUNTER — Telehealth: Payer: Self-pay | Admitting: Rehabilitative and Restorative Service Providers"

## 2021-08-22 ENCOUNTER — Encounter: Payer: Medicare Other | Admitting: Rehabilitative and Restorative Service Providers"

## 2021-08-22 NOTE — Telephone Encounter (Signed)
Pt. Indicated he thought appointment was at 3:45 today not 3:15.  Reminded him of the next appointment time.   Scot Jun, PT, DPT, OCS, ATC 08/22/21  3:40 PM

## 2021-08-29 ENCOUNTER — Ambulatory Visit: Payer: Medicare Other | Admitting: Physical Therapy

## 2021-08-29 ENCOUNTER — Other Ambulatory Visit: Payer: Self-pay

## 2021-08-29 DIAGNOSIS — M25621 Stiffness of right elbow, not elsewhere classified: Secondary | ICD-10-CM

## 2021-08-29 DIAGNOSIS — M25521 Pain in right elbow: Secondary | ICD-10-CM | POA: Diagnosis not present

## 2021-08-29 NOTE — Therapy (Signed)
[facility] Physical Therapy [address] [place], [place], [address] Phone: [phone]   Fax:  [phone]  Physical Therapy Treatment  Patient Details  Name: Jeremy Fowler MRN: [id number] Date of Birth: 1956-05-08 Referring Provider (PT): Donella Stade PA-C   Encounter Date: 08/29/2021   PT End of Session - 08/29/21 1636     Visit Number 2    Number of Visits 6    Date for PT Re-Evaluation 09/28/21    PT Start Time 0923    PT Stop Time 1600    PT Time Calculation (min) 45 min    Activity Tolerance Patient tolerated treatment well    Behavior During Therapy [facility] for tasks assessed/performed             Past Medical History:  Diagnosis Date   Anal fissure 03/14/2016   Colon polyp 04-28-2002   Colonoscopy    HSV-2 infection    Hyperlipemia    Hypertension    Insomnia    Interstitial cystitis 03/05/2016   Prolapsed internal hemorrhoids, grade 2 08/04/2012   Grade 2 hemorrhoids  05/11/15 band ligation right posterior hemorrhoidal bundle 06/14/15 band ligation right anterior hemorrhoidal bundle 07/13/15 LL banded    Prostatitis     Past Surgical History:  Procedure Laterality Date   CALCANEAL OSTEOTOMY W/ INTERNAL FIXATION     COLONOSCOPY  08/04/2012   Procedure: COLONOSCOPY;  Surgeon: Inda Castle, MD;  Location: [facility];  Service: Endoscopy;  Laterality: N/A;  with possible banding   HEMORRHOID BANDING     HEMORRHOID BANDING  2016    There were no vitals filed for this visit.   Subjective Assessment - 08/29/21 1552     Subjective relays the pain is in his Lt lateral elbow but sometimes in his medial elbow with extending his arm. The pain he can live with but it is very annoying to him. He is open to try DN today.    Pertinent History HTN, hyperlipidemia, colon polyps, anal fissure, HSV-2 infection, insomnia    Limitations Lifting;Other (comment)    Patient Stated Goals Stop hurting, get back to using ellipitical    Pain Onset More  than a month ago                               [facility] Adult PT Treatment/Exercise - 08/29/21 0001       Exercises   Exercises Elbow      Elbow Exercises   Other elbow exercises eccentric wrist extension strengthening 3# X20 reps, finger web for extension yellow x20, therabar red for wrist flexion X10 and wrist extension X10      Modalities   Modalities Electrical Stimulation;Moist Heat      Moist Heat Therapy   Number Minutes Moist Heat 10 Minutes    Moist Heat Location Elbow      Electrical Stimulation   Electrical Stimulation Location Rt elbow    Electrical Stimulation Action IFC    Electrical Stimulation Parameters to tolerance for 10 min with heat    Electrical Stimulation Goals Pain      Manual Therapy   Manual therapy comments STM with active compression and skilled palation with DN              Trigger Point Dry Needling - 08/29/21 0001     Consent Given? Yes    Education Handout Provided --   verbal education provided   Muscles Treated  Wrist/Hand Extensor carpi radialis longus/brevis;Extensor digitorum;Extensor carpi ulnaris;Flexor carpi ulnaris;Flexor carpi radialis    Dry Needling Comments fair to good tolerance with this mutiple twitch response reported but no immediate adverse response                     PT Short Term Goals - 08/15/21 0959       PT SHORT TERM GOAL #1   Title pt will be independent in his initial HEP.    Time 2    Status New    Target Date 09/07/21               PT Long Term Goals - 08/15/21 0959       PT LONG TERM GOAL #1   Title Pt will be independent in advanced HEP.    Time 6    Period Weeks    Status New    Target Date 09/28/21      PT LONG TERM GOAL #2   Title Pt will improve his right grip strength to >/= 115 ppsi.    Baseline 106 ppsi    Time 6    Period Weeks    Status New    Target Date 09/28/21      PT LONG TERM GOAL #3   Title Pt will be able to lift 20# from floor and  carry 40 feet across room with pain </= 2/10 in right elbow.    Time 6    Period Weeks    Status New    Target Date 09/28/21      PT LONG TERM GOAL #4   Title Pt will improve his FOTO to >/= 80 %    Baseline 72%    Time 6    Period Weeks    Status New    Target Date 09/28/21                   Plan - 08/29/21 1646     Clinical Impression Statement We trialed treatment of DN today followed by heat and TENS which did appear to reduce pain, we then worked on eccentric wrist extensor strength to this tolerance. We will assess his response to DN next time.    Personal Factors and Comorbidities Comorbidity 2    Comorbidities HTN, hyperlipidemia, colon polyps, anal fissure, HSV-2 infection, insomnia    Examination-Activity Limitations Other;Lift    Examination-Participation Restrictions Other;Yard Work    Stability/Clinical Decision Making Stable/Uncomplicated    Rehab Potential Good    PT Frequency 1x / week    PT Duration 6 weeks    PT Treatment/Interventions Joint Manipulations;Cryotherapy;Electrical Stimulation;Iontophoresis 4mg /ml Dexamethasone;Moist Heat;Ultrasound;Functional mobility training;Therapeutic activities;Therapeutic exercise;Balance training;Neuromuscular re-education;Patient/family education;Dry needling;Passive range of motion;Manual techniques;Taping    PT Next Visit Plan how was DN?    PT Home Exercise Plan Access Code: [email or web address]  URL: [email or web address]  Date: 08/15/2021  Prepared by: Kearney Hard    Exercises  Standing Wrist Flexion Stretch - 2-3 x daily - 7 x weekly - 5 reps - 20 seconds hold  Standing Supinator Stretch - 2-3 x daily - 7 x weekly - 3-5 reps - 10 seconds hold  Standing Wrist Flexion Stretch - 2-3 x daily - 7 x weekly - 3-5 reps  Tennis Elbow Self Massage - 2-3 x daily - 7 x weekly    Consulted and Agree with Plan of Care Patient             Patient will benefit from skilled  therapeutic intervention in order to improve  the following deficits and impairments:  Increased edema, Decreased range of motion, Decreased activity tolerance, Impaired UE functional use, Postural dysfunction, Pain  Visit Diagnosis: Pain in right elbow  Stiffness of right elbow, not elsewhere classified     Problem List Patient Active Problem List   Diagnosis Date Noted   Perianal dermatitis 09/16/2019   Left arm pain 02/09/2019   Interstitial cystitis 03/05/2016   History of basal cell carcinoma 03/17/2015   Vitamin D deficiency 03/17/2015   Medication management 03/17/2015   Tobacco use disorder 03/17/2015   Hyperlipemia    Hypertension    Insomnia    HSV-2 infection    Prolapsed internal hemorrhoids, grade 2 08/04/2012   BPH (benign prostatic hyperplasia) 07/29/2012   Hx of colonic polyps 07/29/2012   Family hx of colon cancer to grandparents 07/29/2012    Debbe Odea, PT,DPT 08/29/2021, 4:49 PM  [facility] Physical Therapy [address] [place], [place], [address] Phone: [phone]   Fax:  [phone]  Name: Jeremy Fowler MRN: [id number] Date of Birth: 05-22-1956

## 2021-09-03 ENCOUNTER — Other Ambulatory Visit: Payer: Self-pay

## 2021-09-03 ENCOUNTER — Ambulatory Visit: Payer: Medicare Other | Admitting: Physical Therapy

## 2021-09-03 ENCOUNTER — Encounter: Payer: Self-pay | Admitting: Physical Therapy

## 2021-09-03 DIAGNOSIS — M25621 Stiffness of right elbow, not elsewhere classified: Secondary | ICD-10-CM | POA: Diagnosis not present

## 2021-09-03 DIAGNOSIS — M25521 Pain in right elbow: Secondary | ICD-10-CM | POA: Diagnosis not present

## 2021-09-03 NOTE — Therapy (Signed)
[facility] Physical Therapy [address] [place], [place], [address] Phone: [phone]   Fax:  [phone]  Physical Therapy Treatment  Patient Details  Name: Jeremy Fowler MRN: [id number] Date of Birth: [birth year] Referring Provider (PT): Donella Stade PA-C   Encounter Date: 09/03/2021   PT End of Session - 09/03/21 0927     Visit Number 3    Number of Visits 6    Date for PT Re-Evaluation 09/28/21    PT Start Time 0846    PT Stop Time 0930    PT Time Calculation (min) 44 min    Activity Tolerance Patient tolerated treatment well    Behavior During Therapy [facility] for tasks assessed/performed             Past Medical History:  Diagnosis Date   Anal fissure 03/14/2016   Colon polyp 04-28-2002   Colonoscopy    HSV-2 infection    Hyperlipemia    Hypertension    Insomnia    Interstitial cystitis 03/05/2016   Prolapsed internal hemorrhoids, grade 2 08/04/2012   Grade 2 hemorrhoids  05/11/15 band ligation right posterior hemorrhoidal bundle 06/14/15 band ligation right anterior hemorrhoidal bundle 07/13/15 LL banded    Prostatitis     Past Surgical History:  Procedure Laterality Date   CALCANEAL OSTEOTOMY W/ INTERNAL FIXATION     COLONOSCOPY  08/04/2012   Procedure: COLONOSCOPY;  Surgeon: Inda Castle, MD;  Location: [facility];  Service: Endoscopy;  Laterality: N/A;  with possible banding   HEMORRHOID BANDING     HEMORRHOID BANDING  2016    There were no vitals filed for this visit.   Subjective Assessment - 09/03/21 0852     Subjective Pt reporting more pain when first waking up and pulling back the covers.    Pertinent History HTN, hyperlipidemia, colon polyps, anal fissure, HSV-2 infection, insomnia    Patient Stated Goals Stop hurting, get back to using ellipitical    Currently in Pain? Yes    Pain Score 2     Pain Orientation Right    Pain Descriptors / Indicators Aching    Pain Type Acute pain    Pain Onset More than a month ago                                [facility] Adult PT Treatment/Exercise - 09/03/21 0001       Exercises   Exercises Elbow      Elbow Exercises   Other elbow exercises eccentric wrist extension strengthening 3# X20 reps, finger web for extension yellow x20, pronation/supination with 2# weight x 20 with slow eccentric control    Other elbow exercises flexor and extensor extensive stretching holding 30 seconds      Modalities   Modalities Electrical Stimulation;Moist Heat      Moist Heat Therapy   Number Minutes Moist Heat 10 Minutes   during E-stim   Moist Heat Location Elbow;Shoulder      Electrical Stimulation   Electrical Stimulation Location Rt elbow extensors, middle deltoid    Electrical Stimulation Action Pre-mod    Electrical Stimulation Parameters intensity to tolerance    Electrical Stimulation Goals Pain      Manual Therapy   Manual therapy comments STM with active compression and skilled palation with DN, STM to right flexors and extensors              Trigger Point Dry Needling -  09/03/21 0001     Consent Given? Yes    Muscles Treated Upper Quadrant Deltoid    Muscles Treated Wrist/Hand Extensor carpi radialis longus/brevis;Extensor digitorum;Extensor carpi ulnaris    Dry Needling Comments multiple twitch responses noted, with good response to treatment    Deltoid Response Twitch response elicited    Extensor carpi radialis longus/brevis Response Twitch response elicited    Extensor digitorum Response Twitch response elicited    Extensor carpi ulnaris Response Twitch response elicited                     PT Short Term Goals - 09/03/21 0931       PT SHORT TERM GOAL #1   Title pt will be independent in his initial HEP.    Status Achieved               PT Long Term Goals - 08/15/21 0959       PT LONG TERM GOAL #1   Title Pt will be independent in advanced HEP.    Time 6    Period Weeks    Status New    Target Date  09/28/21      PT LONG TERM GOAL #2   Title Pt will improve his right grip strength to >/= 115 ppsi.    Baseline 106 ppsi    Time 6    Period Weeks    Status New    Target Date 09/28/21      PT LONG TERM GOAL #3   Title Pt will be able to lift 20# from floor and carry 40 feet across room with pain </= 2/10 in right elbow.    Time 6    Period Weeks    Status New    Target Date 09/28/21      PT LONG TERM GOAL #4   Title Pt will improve his FOTO to >/= 80 %    Baseline 72%    Time 6    Period Weeks    Status New    Target Date 09/28/21                   Plan - 09/03/21 8250     Clinical Impression Statement Pt with good response to DN last visit we DN again today with twitch response noted followed by E-stim and moist heat. Pt still encouraged to low weights and slow increased reps for extensor strengthening. Next treatment possiblly reassess for DN to right tricep. Continue skilled PT.    Comorbidities HTN, hyperlipidemia, colon polyps, anal fissure, HSV-2 infection, insomnia    Examination-Activity Limitations Other;Lift    Examination-Participation Restrictions Other;Yard Work    Stability/Clinical Decision Making Stable/Uncomplicated    Rehab Potential Good    PT Frequency 1x / week    PT Duration 6 weeks    PT Treatment/Interventions Joint Manipulations;Cryotherapy;Electrical Stimulation;Iontophoresis 4mg /ml Dexamethasone;Moist Heat;Ultrasound;Functional mobility training;Therapeutic activities;Therapeutic exercise;Balance training;Neuromuscular re-education;Patient/family education;Dry needling;Passive range of motion;Manual techniques;Taping    PT Next Visit Plan reassess DN response    PT Home Exercise Plan Access Code: [email or web address]  URL: [email or web address]  Date: 08/15/2021  Prepared by: Kearney Hard    Exercises  Standing Wrist Flexion Stretch - 2-3 x daily - 7 x weekly - 5 reps - 20 seconds hold  Standing Supinator Stretch - 2-3 x daily - 7 x  weekly - 3-5 reps - 10 seconds hold  Standing Wrist Flexion Stretch - 2-3 x daily - 7 x weekly - 3-5 reps  Tennis Elbow Self Massage - 2-3 x daily - 7 x weekly    Consulted and Agree with Plan of Care Patient             Patient will benefit from skilled therapeutic intervention in order to improve the following deficits and impairments:  Increased edema, Decreased range of motion, Decreased activity tolerance, Impaired UE functional use, Postural dysfunction, Pain  Visit Diagnosis: Pain in right elbow  Stiffness of right elbow, not elsewhere classified     Problem List Patient Active Problem List   Diagnosis Date Noted   Perianal dermatitis 09/16/2019   Left arm pain 02/09/2019   Interstitial cystitis 03/05/2016   History of basal cell carcinoma 03/17/2015   Vitamin D deficiency 03/17/2015   Medication management 03/17/2015   Tobacco use disorder 03/17/2015   Hyperlipemia    Hypertension    Insomnia    HSV-2 infection    Prolapsed internal hemorrhoids, grade 2 08/04/2012   BPH (benign prostatic hyperplasia) 07/29/2012   Hx of colonic polyps 07/29/2012   Family hx of colon cancer to grandparents 07/29/2012    Oretha Caprice, PT, MPT 09/03/2021, 9:52 AM  [facility] Physical Therapy [address] [place], [place], [address] Phone: [phone]   Fax:  [phone]  Name: JENNINGS CORADO MRN: [id number] Date of Birth: 26-Jan-1956

## 2021-09-10 ENCOUNTER — Other Ambulatory Visit: Payer: Self-pay

## 2021-09-10 ENCOUNTER — Ambulatory Visit: Payer: Medicare Other | Admitting: Physical Therapy

## 2021-09-10 ENCOUNTER — Encounter: Payer: Self-pay | Admitting: Physical Therapy

## 2021-09-10 DIAGNOSIS — M25621 Stiffness of right elbow, not elsewhere classified: Secondary | ICD-10-CM

## 2021-09-10 DIAGNOSIS — M25521 Pain in right elbow: Secondary | ICD-10-CM

## 2021-09-10 NOTE — Therapy (Addendum)
[facility] Physical Therapy [address] [place], [place], [address] Phone: [phone]   Fax:  [phone]  Physical Therapy Treatment [place]  Patient Details  Name: MARTI ACEBO MRN: [id number] Date of Birth: [birth year] Referring Provider (PT): Donella Stade PA-C   Encounter Date: 09/10/2021   PT End of Session - 09/10/21 0849     Visit Number 4    Number of Visits 6    Date for PT Re-Evaluation 09/28/21    PT Start Time 0843    PT Stop Time 0928    PT Time Calculation (min) 45 min    Activity Tolerance Patient tolerated treatment well    Behavior During Therapy [facility] for tasks assessed/performed             Past Medical History:  Diagnosis Date   Anal fissure 03/14/2016   Colon polyp 04-28-2002   Colonoscopy    HSV-2 infection    Hyperlipemia    Hypertension    Insomnia    Interstitial cystitis 03/05/2016   Prolapsed internal hemorrhoids, grade 2 08/04/2012   Grade 2 hemorrhoids  05/11/15 band ligation right posterior hemorrhoidal bundle 06/14/15 band ligation right anterior hemorrhoidal bundle 07/13/15 LL banded    Prostatitis     Past Surgical History:  Procedure Laterality Date   CALCANEAL OSTEOTOMY W/ INTERNAL FIXATION     COLONOSCOPY  08/04/2012   Procedure: COLONOSCOPY;  Surgeon: Inda Castle, MD;  Location: [facility];  Service: Endoscopy;  Laterality: N/A;  with possible banding   HEMORRHOID BANDING     HEMORRHOID BANDING  2016    There were no vitals filed for this visit.   Subjective Assessment - 09/10/21 0847     Subjective Pt reporting only mild improvments since beginning therapy. Pt stating he has been consistent in his HEP. Pt stating supination and pain with lifting coffee cup.    Pertinent History HTN, hyperlipidemia, colon polyps, anal fissure, HSV-2 infection, insomnia    Limitations Lifting;Other (comment)    Patient Stated Goals Stop hurting, get back to using ellipitical    Currently in Pain? Yes    Pain  Score 2     Pain Location Elbow    Pain Orientation Right    Pain Descriptors / Indicators Aching    Pain Type Acute pain    Pain Onset More than a month ago                [facility] PT Assessment - 09/10/21 0001       Assessment   Medical Diagnosis M77.11 lateral epicondylitis of right elbow    Referring Provider (PT) Gerrianne Scale magnant PA-C    Hand Dominance Right    Prior Therapy yes after tricep surgery      AROM   Right Elbow Flexion 145    Right Elbow Extension 0    Right Wrist Extension 45 Degrees    Right Wrist Flexion 60 Degrees    Right Wrist Radial Deviation 26 Degrees    Right Wrist Ulnar Deviation 30 Degrees                           [facility] Adult PT Treatment/Exercise - 09/10/21 0001       Exercises   Exercises Elbow      Elbow Exercises   Other elbow exercises eccentric wrist extension strengthening 2x10, pronation/supination x 10, slow eccentrics, UBE x 6 (3 minutes each direction)l    Other elbow  exercises flexor and extensor extensive stretching holding 30 seconds      Modalities   Modalities Electrical Stimulation;Moist Heat      Moist Heat Therapy   Number Minutes Moist Heat 10 Minutes    Moist Heat Location Elbow   extensors     Electrical Stimulation   Electrical Stimulation Location Rt elbow extensors, middle deltoid    Electrical Stimulation Action IFC    Electrical Stimulation Parameters intensity to tolerance    Electrical Stimulation Goals Pain      Manual Therapy   Manual therapy comments STM with active compression and skilled palation with DN, STM to right flexors and extensors and right tricep              Trigger Point Dry Needling - 09/10/21 0001     Consent Given? Yes    Muscles Treated Upper Quadrant Triceps    Muscles Treated Wrist/Hand Extensor carpi radialis longus/brevis   brachioradialis   Dry Needling Comments multiple twitch responses noted, with good response to treatment    Triceps Response  Twitch response elicited    Extensor carpi radialis longus/brevis Response Twitch response elicited                     PT Short Term Goals - 09/03/21 0931       PT SHORT TERM GOAL #1   Title pt will be independent in his initial HEP.    Status Achieved               PT Long Term Goals - 09/10/21 0930       PT LONG TERM GOAL #1   Title Pt will be independent in advanced HEP.    Status On-going      PT LONG TERM GOAL #2   Title Pt will improve his right grip strength to >/= 115 ppsi.    Status On-going      PT LONG TERM GOAL #3   Title Pt will be able to lift 20# from floor and carry 40 feet across room with pain </= 2/10 in right elbow.    Status On-going      PT LONG TERM GOAL #4   Title Pt will improve his FOTO to >/= 80 %    Status On-going                   Plan - 09/10/21 0926     Clinical Impression Statement Pt reporting pain of 2/10 in left elbow and forarm with pain also radiating down from right triceps. DN performed to right tricep and brachiradialis and extensors with twicth response elicited. Pt tolerating gente ROM and exercises today. Pt instructed to focus his HEP on stretching to see if there is a better response with decreased pain.  Next visit assess response to DN and progress pt's exercises at tolerated.    Personal Factors and Comorbidities Comorbidity 2    Comorbidities HTN, hyperlipidemia, colon polyps, anal fissure, HSV-2 infection, insomnia    Examination-Participation Restrictions Other;Yard Work    Stability/Clinical Decision Making Stable/Uncomplicated    Rehab Potential Good    PT Frequency 1x / week    PT Duration 6 weeks    PT Treatment/Interventions Joint Manipulations;Cryotherapy;Electrical Stimulation;Iontophoresis 4m/ml Dexamethasone;Moist Heat;Ultrasound;Functional mobility training;Therapeutic activities;Therapeutic exercise;Balance training;Neuromuscular re-education;Patient/family education;Dry  needling;Passive range of motion;Manual techniques;Taping    PT Next Visit Plan reassess DN response, update HEP as needed    PT Home Exercise Plan Access Code: [email or web address]  URL: [email or web address]  Date: 08/15/2021  Prepared by: Kearney Hard    Exercises  Standing Wrist Flexion Stretch - 2-3 x daily - 7 x weekly - 5 reps - 20 seconds hold  Standing Supinator Stretch - 2-3 x daily - 7 x weekly - 3-5 reps - 10 seconds hold  Standing Wrist Flexion Stretch - 2-3 x daily - 7 x weekly - 3-5 reps  Tennis Elbow Self Massage - 2-3 x daily - 7 x weekly    Consulted and Agree with Plan of Care Patient             Patient will benefit from skilled therapeutic intervention in order to improve the following deficits and impairments:  Increased edema, Decreased range of motion, Decreased activity tolerance, Impaired UE functional use, Postural dysfunction, Pain  Visit Diagnosis: Pain in right elbow  Stiffness of right elbow, not elsewhere classified     Problem List Patient Active Problem List   Diagnosis Date Noted   Perianal dermatitis 09/16/2019   Left arm pain 02/09/2019   Interstitial cystitis 03/05/2016   History of basal cell carcinoma 03/17/2015   Vitamin D deficiency 03/17/2015   Medication management 03/17/2015   Tobacco use disorder 03/17/2015   Hyperlipemia    Hypertension    Insomnia    HSV-2 infection    Prolapsed internal hemorrhoids, grade 2 08/04/2012   BPH (benign prostatic hyperplasia) 07/29/2012   Hx of colonic polyps 07/29/2012   Family hx of colon cancer to grandparents 07/29/2012    Oretha Caprice, PT, MPT 09/10/2021, 1:38 PM  PHYSICAL THERAPY DISCHARGE SUMMARY  Visits from Start of Care: 4  Current functional level related to goals / functional outcomes: See note   Remaining deficits: See note   Education / Equipment: HEP   Patient agrees to discharge. Patient goals were partially met. Patient is being discharged due to not  returning since the last visit.  Scot Jun, PT, DPT, OCS, ATC 11/12/21  3:00 PM     [facility] Physical Therapy [address] [place], [place], [address] Phone: [phone]   Fax:  [phone]  Name: LADERRICK WILK MRN: [id number] Date of Birth: [birth year]

## 2021-09-17 ENCOUNTER — Encounter: Payer: Medicare Other | Admitting: Physical Therapy

## 2021-09-25 ENCOUNTER — Encounter: Payer: Medicare Other | Admitting: Physical Therapy

## 2021-10-30 ENCOUNTER — Other Ambulatory Visit: Payer: Self-pay | Admitting: Internal Medicine

## 2021-10-30 DIAGNOSIS — E782 Mixed hyperlipidemia: Secondary | ICD-10-CM

## 2021-11-12 NOTE — Progress Notes (Signed)
Complete Physical   Assessment and Plan:  Burdett was seen today for annual exam.  Diagnoses and all orders for this visit:  Encounter for general adult medical examination with abnormal findings Yearly -     CBC with Differential/Platelet -     COMPLETE METABOLIC PANEL WITH GFR -     TSH -     Magnesium  Labile hypertension No medications at this time Monitor blood pressure at home; call if consistently over 130/80 Continue DASH diet.   Reminder to go to the ER if any CP, SOB, nausea, dizziness, severe HA, changes vision/speech, left arm numbness and tingling and jaw pain. -     CBC with Differential/Platelet -     COMPLETE METABOLIC PANEL WITH GFR -     TSH -     Magnesium  Hyperlipidemia, mixed Continue medications: Rosuvastatin 20mg  1/2 tab daily, higher dose causes myalgias Discussed dietary and exercise modifications Low fat diet -     Lipid panel  Abnormal glucose Discussed dietary and exercise modifications -     Hemoglobin A1c  Vitamin D deficiency Continue supplementation to maintain goal of 70-100 Taking Vitamin D taking multivitamin daily -     VITAMIN D 25 Hydroxy (Vit-D Deficiency, Fractures)  BPH with obstruction/lower urinary tract symptoms Doing well at this time Continue medications: Alfuzosin 10mg  Will continue to monitor -     PSA  Smoker Cigars, intermittent  Screening, ischemic heart disease -     EKG 12-Lead  Screening for thyroid disorder      - TSH  Screening PSA (prostate specific antigen) -     PSA  Screening for blood or protein in urine -     Routine UA with reflex microscopic -  Microalbumin/Creatinine urine ratio  Medication Management - Magnesium  Diarrhea unspecified Lomotil as needed to use on trip overseas, normally has diarrhea episodes with travel If develops fever advised to see treatment  Erectile Dysfunction Viagra 50 mg 1 tab PO as needed   Continue diet and meds as discussed. Further disposition pending  results of labs. Discussed med's effects and SE's.  Patient agrees with plan of care and opportunity to ask questions/voice concerns. Over 30 minutes of chart review, interview, exam, counseling, and critical decision making was performed.   Future Appointments  Date Time Provider [place]  11/13/2022 10:00 AM Magda Bernheim, NP [facility] None    ----------------------------------------------------------------------------------------------------------------------  HPI 66 y.o. male  presents for 3 month follow up on HTN, HLD, abnormal glucose, weight and vitamin D deficiency.   He donates blood and platelets routinely.  Reports last time was 2 weeks ago   He does workout regularly at the [facility].  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 He has had many stressors this past 6 weeks and is heading to [place] for a funeral.   Otherwise he does not have any health or medication concerns.  He is having erectile dysfunction, more maintaining an erection.   Continues to have right elbow pain- had previously done physical therapy and dry needling.   BMI is Body mass index is 30.24 kg/m., he has been working on diet and exercise. Wt Readings from Last 3 Encounters:  11/13/21 216 lb 12.8 oz (98.3 kg)  11/13/20 221 lb (100.2 kg)  06/08/20 217 lb 3.2 oz (98.5 kg)    HTN predates 2003 His blood pressure has  been controlled at home 110-120/70's today their BP is BP:  128/66 BP Readings from Last 3 Encounters:  11/13/21 128/66  11/13/20 128/74  06/08/20 122/84     He does workout. He denies any cardiac symptoms, chest pains, palpitations, shortness of breath, dizziness or lower extremity edema.      He is on cholesterol medication Rosuvastatin 20mg  1/2 tab daily and denies myalgias, had myalgias on 20 mg daily. His cholesterol is not at goal. The cholesterol last visit was:   Lab Results  Component Value Date   CHOL 205 (H) 11/13/2020    HDL 56 11/13/2020   LDLCALC 118 (H) 11/13/2020   TRIG 190 (H) 11/13/2020   CHOLHDL 3.7 11/13/2020    He has been working on diet and exercise for prediabetes, and denies polydipsia, polyuria, visual disturbances, vomiting and weight loss. Last A1C in the office was:  Lab Results  Component Value Date   HGBA1C 5.2 11/13/2020   Patient is on Vitamin D supplement.   Lab Results  Component Value Date   VD25OH 55 11/13/2020       Current Medications:  Current Outpatient Medications on File Prior to Visit  Medication Sig   acyclovir (ZOVIRAX) 400 MG tablet Take  1 tablet  2 x /day  prophylaxis for mouth ulcers   alfuzosin (UROXATRAL) 10 MG 24 hr tablet Take 1 tablet Daily for Prostate   ANUCORT-HC 25 MG suppository INSERT ONE SUPPOSITORY RECTALLY THREE TIMES DAILY TO 4 TIMES DAILY   Cholecalciferol (VITAMIN D) 50 MCG (2000 UT) tablet Take 2,000 Units by mouth daily.   clotrimazole-betamethasone (LOTRISONE) cream APPLY 1 CREAM TOPICALLY TWICE DAILY   Eszopiclone 3 MG TABS TAKE 1 TABLET BY MOUTH EVERY DAY AT BEDTIME AS NEEDED   Multiple Vitamin (MULTIVITAMIN) tablet Take 1 tablet by mouth daily.   Omega-3 Fatty Acids (FISH OIL PO) Take 1 capsule by mouth daily.   rosuvastatin (CRESTOR) 20 MG tablet TAKE 1 TABLET BY MOUTH ONCE DAILY FOR CHOLESTEROL   bisoprolol-hydrochlorothiazide (ZIAC) 5-6.25 MG tablet Take  1 tablet  every Morning for BP   No current facility-administered medications on file prior to visit.    Allergies:  Allergies  Allergen Reactions   Sulfa Antibiotics     dizzy disoriented   Elemental Sulfur Other (See Comments)    disoriented     Medical History:  Past Medical History:  Diagnosis Date   Anal fissure 03/14/2016   Colon polyp 04-28-2002   Colonoscopy    HSV-2 infection    Hyperlipemia    Hypertension    Insomnia    Interstitial cystitis 03/05/2016   Prolapsed internal hemorrhoids, grade 2 08/04/2012   Grade 2 hemorrhoids  05/11/15 band ligation right  posterior hemorrhoidal bundle 06/14/15 band ligation right anterior hemorrhoidal bundle 07/13/15 LL banded    Prostatitis     Family history- Reviewed and unchanged   Social history- Reviewed and unchanged   Names of Other Physician/Practitioners you currently use: 1. [facility] here for primary care 2. Eye Exam: Due for 2022 3. Dental Exam :Q48months  Patient Care Team: Unk Pinto, MD as PCP - General (Internal Medicine) Marybelle Killings, MD as Consulting Physician (Orthopedic Surgery) Crista Luria, MD as Consulting Physician (Dermatology) Marlou Sa Tonna Corner, MD as Consulting Physician (Orthopedic Surgery)   Screening Tests: Immunization History  Administered Date(s) Administered   Influenza-Unspecified 06/30/2018   Moderna Sars-Covid-2 Vaccination 12/16/2019, 01/14/2020, 08/08/2020   PPD Test 03/31/2018, 05/04/2019, 06/08/2020   Td 01/22/2007   Tdap 10/01/2017   Zoster Recombinat (Shingrix) 10/01/2017,  11/01/2017     Vaccinations: TD or Tdap: 2019  Influenza: 2019, declined  Pneumococcal: Discussed next year Shingrix: 2019, compelete   Preventative Care: Last colonoscopy: 2013, due 07/2022 Hep C screening (2025-4270): 03/2015 PSA Screening : today   Review of Systems:  Review of Systems  Constitutional:  Negative for chills and fever.  HENT:  Negative for congestion, hearing loss, sinus pain, sore throat and tinnitus.   Eyes:  Negative for blurred vision and double vision.  Respiratory:  Negative for cough, hemoptysis, sputum production, shortness of breath and wheezing.   Cardiovascular:  Negative for chest pain, palpitations and leg swelling.  Gastrointestinal:  Negative for abdominal pain, constipation, diarrhea, heartburn, nausea and vomiting.  Genitourinary:  Negative for dysuria and urgency.       Gets up once a night to go to the bathroom  Musculoskeletal:  Negative for back pain, falls, joint pain, myalgias and  neck pain.  Skin:  Negative for rash.  Neurological:  Negative for dizziness, tingling, tremors, weakness and headaches.  Endo/Heme/Allergies:  Does not bruise/bleed easily.  Psychiatric/Behavioral:  Negative for depression and suicidal ideas. The patient is not nervous/anxious and does not have insomnia.      Physical Exam: BP 128/66    Pulse 71    Temp 97.7 F (36.5 C)    Ht 5\' 11"  (1.803 m)    Wt 216 lb 12.8 oz (98.3 kg)    SpO2 98%    BMI 30.24 kg/m  Wt Readings from Last 3 Encounters:  11/13/21 216 lb 12.8 oz (98.3 kg)  11/13/20 221 lb (100.2 kg)  06/08/20 217 lb 3.2 oz (98.5 kg)   General Appearance: Well nourished, in no apparent distress. Eyes: PERRLA, EOMs, conjunctiva no swelling or erythema Sinuses: No Frontal/maxillary tenderness ENT/Mouth: Ext aud canals clear, TMs without erythema, bulging. No erythema, swelling, or exudate on post pharynx.  Tonsils not swollen or erythematous. Hearing normal.  Neck: Supple, thyroid normal.  Respiratory: Respiratory effort normal, BS equal bilaterally without rales, rhonchi, wheezing or stridor.  Cardio: RRR with no MRGs. Brisk peripheral pulses without edema.  Abdomen: Soft, + BS.  Non tender, no guarding, rebound, hernias, masses. Lymphatics: Non tender without lymphadenopathy.  Musculoskeletal: Full ROM, 5/5 strength, Normal gait Skin: Warm, dry without rashes, lesions, ecchymosis.  Neuro: Cranial nerves intact. No cerebellar symptoms.  Psych: Awake and oriented X 3, normal affect, Insight and Judgment appropriate.   EKG: NSR, no ST changes   [facility]  11/13/2021

## 2021-11-13 ENCOUNTER — Ambulatory Visit (INDEPENDENT_AMBULATORY_CARE_PROVIDER_SITE_OTHER): Payer: Medicare Other | Admitting: Nurse Practitioner

## 2021-11-13 ENCOUNTER — Other Ambulatory Visit: Payer: Self-pay

## 2021-11-13 ENCOUNTER — Encounter: Payer: Self-pay | Admitting: Nurse Practitioner

## 2021-11-13 VITALS — BP 128/66 | HR 71 | Temp 97.7°F | Ht 71.0 in | Wt 216.8 lb

## 2021-11-13 DIAGNOSIS — R7309 Other abnormal glucose: Secondary | ICD-10-CM

## 2021-11-13 DIAGNOSIS — N138 Other obstructive and reflux uropathy: Secondary | ICD-10-CM

## 2021-11-13 DIAGNOSIS — Z1389 Encounter for screening for other disorder: Secondary | ICD-10-CM

## 2021-11-13 DIAGNOSIS — N528 Other male erectile dysfunction: Secondary | ICD-10-CM

## 2021-11-13 DIAGNOSIS — E782 Mixed hyperlipidemia: Secondary | ICD-10-CM

## 2021-11-13 DIAGNOSIS — Z136 Encounter for screening for cardiovascular disorders: Secondary | ICD-10-CM

## 2021-11-13 DIAGNOSIS — R197 Diarrhea, unspecified: Secondary | ICD-10-CM

## 2021-11-13 DIAGNOSIS — R0989 Other specified symptoms and signs involving the circulatory and respiratory systems: Secondary | ICD-10-CM

## 2021-11-13 DIAGNOSIS — Z125 Encounter for screening for malignant neoplasm of prostate: Secondary | ICD-10-CM

## 2021-11-13 DIAGNOSIS — Z0000 Encounter for general adult medical examination without abnormal findings: Secondary | ICD-10-CM | POA: Diagnosis not present

## 2021-11-13 DIAGNOSIS — Z0001 Encounter for general adult medical examination with abnormal findings: Secondary | ICD-10-CM

## 2021-11-13 DIAGNOSIS — Z1329 Encounter for screening for other suspected endocrine disorder: Secondary | ICD-10-CM

## 2021-11-13 DIAGNOSIS — E559 Vitamin D deficiency, unspecified: Secondary | ICD-10-CM | POA: Diagnosis not present

## 2021-11-13 DIAGNOSIS — F17200 Nicotine dependence, unspecified, uncomplicated: Secondary | ICD-10-CM

## 2021-11-13 DIAGNOSIS — Z79899 Other long term (current) drug therapy: Secondary | ICD-10-CM

## 2021-11-13 DIAGNOSIS — N401 Enlarged prostate with lower urinary tract symptoms: Secondary | ICD-10-CM

## 2021-11-13 MED ORDER — SILDENAFIL CITRATE 50 MG PO TABS: 50.0000 mg | ORAL_TABLET | ORAL | 1 refills | Status: AC | PRN

## 2021-11-13 MED ORDER — DIPHENOXYLATE-ATROPINE 2.5-0.025 MG PO TABS: 1.0000 | ORAL_TABLET | Freq: Four times a day (QID) | ORAL | 1 refills | Status: AC | PRN

## 2021-11-13 NOTE — Patient Instructions (Signed)
Sildenafil Tablets (Erectile Dysfunction) What is this medication? SILDENAFIL (sil DEN a fil) treats erectile dysfunction (ED). It works by increasing blood flow to the penis, which helps to maintain an erection. This medicine may be used for other purposes; ask your health care provider or pharmacist if you have questions. COMMON BRAND NAME(S): Viagra What should I tell my care team before I take this medication? They need to know if you have any of these conditions: Bleeding disorders Eye or vision problems, including a rare inherited eye disease called retinitis pigmentosa Anatomical deformation of the penis, Peyronie's disease, or history of priapism (painful and prolonged erection) Heart disease, angina, a history of heart attack, irregular heartbeats, or other heart problems High or low blood pressure History of blood diseases, like sickle cell anemia or leukemia History of stomach bleeding Kidney disease Liver disease Stroke An unusual or allergic reaction to sildenafil, other medications, foods, dyes, or preservatives Pregnant or trying to get pregnant Breast-feeding How should I use this medication? Take this medication by mouth with a glass of water. Follow the directions on the prescription label. The dose is usually taken 1 hour before sexual activity. You should not take the dose more than once per day. Do not take your medication more often than directed. Talk to your care team about the use of this medication in children. This medication is not used in children for this condition. Overdosage: If you think you have taken too much of this medicine contact a poison control center or emergency room at once. NOTE: This medicine is only for you. Do not share this medicine with others. What if I miss a dose? This does not apply. Do not take double or extra doses. What may interact with this medication? Do not take this medication with any of the following: Cisapride Nitrates  like amyl nitrite, isosorbide dinitrate, isosorbide mononitrate, nitroglycerin Riociguat This medication may also interact with the following: Antiviral medications for HIV or AIDS Bosentan Certain medications for benign prostatic hyperplasia (BPH) Certain medications for blood pressure Certain medications for fungal infections like ketoconazole and itraconazole Cimetidine Erythromycin Rifampin This list may not describe all possible interactions. Give your health care provider a list of all the medicines, herbs, non-prescription drugs, or dietary supplements you use. Also tell them if you smoke, drink alcohol, or use illegal drugs. Some items may interact with your medicine. What should I watch for while using this medication? If you notice any changes in your vision while taking this medication, call your care team as soon as possible. Stop using this medication and call your care team right away if you have a loss of sight in one or both eyes. Contact your care team right away if you have an erection that lasts longer than 4 hours or if it becomes painful. This may be a sign of a serious problem and must be treated right away to prevent permanent damage. If you experience symptoms of nausea, dizziness, chest pain or arm pain upon initiation of sexual activity after taking this medication, you should refrain from further activity and call your care team as soon as possible. Do not drink alcohol to excess (examples, 5 glasses of wine or 5 shots of whiskey) when taking this medication. When taken in excess, alcohol can increase your chances of getting a headache or getting dizzy, increasing your heart rate or lowering your blood pressure. Using this medication does not protect you or your partner against HIV infection (the virus that causes AIDS)  or other sexually transmitted diseases. What side effects may I notice from receiving this medication? Side effects that you should report to your care  team as soon as possible: Allergic reactions--skin rash, itching, hives, swelling of the face, lips, tongue, or throat Hearing loss or ringing in ears Heart attack--pain or tightness in the chest, shoulders, arms, or jaw, nausea, shortness of breath, cold or clammy skin, feeling faint or lightheaded Heart rhythm changes--fast or irregular heartbeat, dizziness, feeling faint or lightheaded, chest pain, trouble breathing Low blood pressure--dizziness, feeling faint or lightheaded, blurry vision New or worsening shortness of breath Prolonged or painful erection Stroke--sudden numbness or weakness of the face, arm, or leg, trouble speaking, confusion, trouble walking, loss of balance or coordination, dizziness, severe headache, change in vision Sudden vision loss in one or both eyes Side effects that usually do not require medical attention (report to your care team if they continue or are bothersome): Facial flushing or redness Headache Nosebleed Runny or stuffy nose Trouble sleeping Upset stomach This list may not describe all possible side effects. Call your doctor for medical advice about side effects. You may report side effects to FDA at 1-800-FDA-1088. Where should I keep my medication? Keep out of reach of children and pets. Store at room temperature between 15 and 30 degrees C (59 and 86 degrees F). Throw away any unused medication after the expiration date. NOTE: This sheet is a summary. It may not cover all possible information. If you have questions about this medicine, talk to your doctor, pharmacist, or health care provider.  2022 Elsevier/Gold Standard (2020-11-10 00:00:00)

## 2021-11-14 ENCOUNTER — Other Ambulatory Visit: Payer: Self-pay | Admitting: Internal Medicine

## 2021-11-14 LAB — URINALYSIS, ROUTINE W REFLEX MICROSCOPIC
Bacteria, UA: NONE SEEN /HPF
Bilirubin Urine: NEGATIVE
Glucose, UA: NEGATIVE
Hgb urine dipstick: NEGATIVE
Hyaline Cast: NONE SEEN /LPF
Ketones, ur: NEGATIVE
Nitrite: NEGATIVE
Protein, ur: NEGATIVE
RBC / HPF: NONE SEEN /HPF (ref 0–2)
Specific Gravity, Urine: 1.017 (ref 1.001–1.035)
Squamous Epithelial / HPF: NONE SEEN /HPF (ref <=5)
pH: 6.5 (ref 5.0–8.0)

## 2021-11-14 LAB — CBC WITH DIFFERENTIAL/PLATELET
Absolute Monocytes: 397 cells/uL (ref 200–950)
Basophils Absolute: 43 cells/uL (ref 0–200)
Basophils Relative: 0.7 %
Eosinophils Absolute: 79 cells/uL (ref 15–500)
Eosinophils Relative: 1.3 %
HCT: 44.4 % (ref 38.5–50.0)
Hemoglobin: 14.9 g/dL (ref 13.2–17.1)
Lymphs Abs: 1568 cells/uL (ref 850–3900)
MCH: 33.1 pg — ABNORMAL HIGH (ref 27.0–33.0)
MCHC: 33.6 g/dL (ref 32.0–36.0)
MCV: 98.7 fL (ref 80.0–100.0)
MPV: 10.9 fL (ref 7.5–12.5)
Monocytes Relative: 6.5 %
Neutro Abs: 4014 cells/uL (ref 1500–7800)
Neutrophils Relative %: 65.8 %
Platelets: 205 10*3/uL (ref 140–400)
RBC: 4.5 10*6/uL (ref 4.20–5.80)
RDW: 12.8 % (ref 11.0–15.0)
Total Lymphocyte: 25.7 %
WBC: 6.1 10*3/uL (ref 3.8–10.8)

## 2021-11-14 LAB — COMPLETE METABOLIC PANEL WITH GFR
AG Ratio: 2.2 (calc) (ref 1.0–2.5)
ALT: 32 U/L (ref 9–46)
AST: 25 U/L (ref 10–35)
Albumin: 5.1 g/dL (ref 3.6–5.1)
Alkaline phosphatase (APISO): 77 U/L (ref 35–144)
BUN: 15 mg/dL (ref 7–25)
CO2: 31 mmol/L (ref 20–32)
Calcium: 9.9 mg/dL (ref 8.6–10.3)
Chloride: 102 mmol/L (ref 98–110)
Creat: 1.06 mg/dL (ref 0.70–1.35)
Globulin: 2.3 g/dL (calc) (ref 1.9–3.7)
Glucose, Bld: 102 mg/dL — ABNORMAL HIGH (ref 65–99)
Potassium: 4.5 mmol/L (ref 3.5–5.3)
Sodium: 141 mmol/L (ref 135–146)
Total Bilirubin: 0.9 mg/dL (ref 0.2–1.2)
Total Protein: 7.4 g/dL (ref 6.1–8.1)
eGFR: 78 mL/min/{1.73_m2} (ref >=60)

## 2021-11-14 LAB — MICROALBUMIN / CREATININE URINE RATIO
Creatinine, Urine: 115 mg/dL (ref 20–320)
Microalb Creat Ratio: 3 mcg/mg creat (ref <30)
Microalb, Ur: 0.3 mg/dL

## 2021-11-14 LAB — VITAMIN D 25 HYDROXY (VIT D DEFICIENCY, FRACTURES): Vit D, 25-Hydroxy: 63 ng/mL (ref 30–100)

## 2021-11-14 LAB — HEMOGLOBIN A1C
Hgb A1c MFr Bld: 5.2 % of total Hgb (ref <5.7)
Mean Plasma Glucose: 103 mg/dL
eAG (mmol/L): 5.7 mmol/L

## 2021-11-14 LAB — MICROSCOPIC MESSAGE

## 2021-11-14 LAB — LIPID PANEL
Cholesterol: 189 mg/dL (ref <200)
HDL: 60 mg/dL (ref >=40)
LDL Cholesterol (Calc): 104 mg/dL (calc) — ABNORMAL HIGH
Non-HDL Cholesterol (Calc): 129 mg/dL (calc) (ref <130)
Total CHOL/HDL Ratio: 3.2 (calc) (ref <5.0)
Triglycerides: 151 mg/dL — ABNORMAL HIGH (ref <150)

## 2021-11-14 LAB — PSA: PSA: 1.35 ng/mL (ref <=4.00)

## 2021-11-14 LAB — TSH: TSH: 1.71 mIU/L (ref 0.40–4.50)

## 2021-11-14 LAB — MAGNESIUM: Magnesium: 2.4 mg/dL (ref 1.5–2.5)

## 2021-11-22 ENCOUNTER — Other Ambulatory Visit: Payer: Self-pay | Admitting: Nurse Practitioner

## 2021-11-22 DIAGNOSIS — E782 Mixed hyperlipidemia: Secondary | ICD-10-CM

## 2021-11-22 MED ORDER — ROSUVASTATIN CALCIUM 20 MG PO TABS: ORAL_TABLET | ORAL | 3 refills | Status: DC

## 2021-12-11 DIAGNOSIS — H524 Presbyopia: Secondary | ICD-10-CM | POA: Diagnosis not present

## 2021-12-11 DIAGNOSIS — H5213 Myopia, bilateral: Secondary | ICD-10-CM | POA: Diagnosis not present

## 2021-12-11 DIAGNOSIS — H52222 Regular astigmatism, left eye: Secondary | ICD-10-CM | POA: Diagnosis not present

## 2021-12-17 DIAGNOSIS — H524 Presbyopia: Secondary | ICD-10-CM | POA: Diagnosis not present

## 2022-05-02 ENCOUNTER — Other Ambulatory Visit: Payer: Self-pay | Admitting: Internal Medicine

## 2022-05-02 ENCOUNTER — Encounter: Payer: Self-pay | Admitting: Internal Medicine

## 2022-05-02 MED ORDER — ESZOPICLONE 3 MG PO TABS: ORAL_TABLET | ORAL | 0 refills | Status: DC

## 2022-05-15 ENCOUNTER — Encounter: Payer: Self-pay | Admitting: Internal Medicine

## 2022-05-15 NOTE — Patient Instructions (Signed)

## 2022-05-15 NOTE — Progress Notes (Unsigned)
Future Appointments  Date Time Provider Department  05/16/2022  9:30 AM Unk Pinto, MD [facility]  11/13/2022 10:00 AM Alycia Rossetti, NP [facility]    History of Present Illness:       This very nice 66 y.o. MWM presents for 6 month follow up with HTN, HLD, Pre-Diabetes and Vitamin D Deficiency.        Patient is monitored expectantly for labile  HTN  since 2003  & BP has been controlled at home. Today's BP is at goal -  130/72. Patient has had no complaints of any cardiac type chest pain, palpitations, dyspnea / orthopnea / PND, dizziness, claudication, or dependent edema.       Hyperlipidemia is near controlled with diet & Rosuvastatin.   Patient denies myalgias or other med SE's. Last Lipids were near goal :  Lab Results  Component Value Date   CHOL 189 11/13/2021   HDL 60 11/13/2021   LDLCALC 104 (H) 11/13/2021   TRIG 151 (H) 11/13/2021   CHOLHDL 3.2 11/13/2021     Also, the patient  is monitored expectantly for glucose intolerance and has had no symptoms of reactive hypoglycemia, diabetic polys, paresthesias or visual blurring.  Last A1c was Normal & at goal :  Lab Results  Component Value Date   HGBA1C 5.2 11/13/2021                                                            Further, the patient also has history of Vitamin D Deficiency  ("44" /2020) and supplements vitamin D. Last vitamin D was at goal :  Lab Results  Component Value Date   VD25OH 63 11/13/2021     Current Outpatient Medications on File Prior to Visit  Medication Sig   acyclovir (ZOVIRAX) 400 MG tablet Take  1 tablet  2 x /day  prophylaxis for mouth ulcers   alfuzosin (UROXATRAL) 10 MG 24 hr tablet Take 1 tablet Daily for Prostate   Cholecalciferol (VITAMIN D) 50 MCG (2000 UT) tablet Take 2,000 Units by mouth daily.   clotrimazole-betamethasone (LOTRISONE) cream APPLY 1 CREAM TOPICALLY TWICE DAILY   LOMOTIL) 2.5-0.025 MG tablet Take 1 tablet 4 times daily as needed for diarrhea     Eszopiclone 3 MG TABS Take 1 tablet at bedtime  ONLY if needed for Sleep    hydrocortisone (ANUCORT-HC) 25 MG suppository INSERT 1 SUPPOSITORY RECTALLY 3 TO 4 TIMES DAILY   Multiple Vitamin (MULTIVITAMIN) tablet Take 1 tablet  daily.   Omega-3 Fatty Acids (FISH OIL PO) Take 1 capsule  daily.   PSYLLIUM PO Take by mouth.   rosuvastatin (CRESTOR) 20 MG tablet TAKE 1 TABLET  DAILY FOR CHOLESTEROL   sildenafil (VIAGRA) 50 MG tablet Take 1 tablet as needed for erectile dysfunction.     Allergies  Allergen Reactions   Sulfa Antibiotics     dizzy disoriented   Elemental Sulfur Other (See Comments)    disoriented     PMHx:   Past Medical History:  Diagnosis Date   Anal fissure 03/14/2016   Colon polyp 04-28-2002   Colonoscopy    HSV-2 infection    Hyperlipemia    Hypertension    Insomnia    Interstitial cystitis 03/05/2016   Prolapsed internal hemorrhoids, grade 2 08/04/2012   Grade  2 hemorrhoids  05/11/15 band ligation right posterior hemorrhoidal bundle 06/14/15 band ligation right anterior hemorrhoidal bundle 07/13/15 LL banded    Prostatitis      Immunization History  Administered Date(s) Administered   Influenza 06/30/2018   Moderna Sars-Covid-2 Vacc 12/16/2019, 01/14/2020, 08/08/2020   PPD Test 03/31/2018, 05/04/2019, 06/08/2020   Td 01/22/2007   Tdap 10/01/2017   Zoster Recombinat (Shingrix) 10/01/2017, 11/01/2017     Past Surgical History:  Procedure Laterality Date   CALCANEAL OSTEOTOMY W/ INTERNAL FIXATION     COLONOSCOPY  08/04/2012   Procedure: COLONOSCOPY;  Surgeon: Inda Castle, MD;  Location: [facility];  Service: Endoscopy;  Laterality: N/A;  with possible banding   HEMORRHOID BANDING     HEMORRHOID BANDING  2016    FHx:    Reviewed / unchanged  SHx:    Reviewed / unchanged   Systems Review:  Constitutional: Denies fever, chills, wt changes, headaches, insomnia, fatigue, night sweats, change in appetite. Eyes: Denies redness, blurred vision,  diplopia, discharge, itchy, watery eyes.  ENT: Denies discharge, congestion, post nasal drip, epistaxis, sore throat, earache, hearing loss, dental pain, tinnitus, vertigo, sinus pain, snoring.  CV: Denies chest pain, palpitations, irregular heartbeat, syncope, dyspnea, diaphoresis, orthopnea, PND, claudication or edema. Respiratory: denies cough, dyspnea, DOE, pleurisy, hoarseness, laryngitis, wheezing.  Gastrointestinal: Denies dysphagia, odynophagia, heartburn, reflux, water brash, abdominal pain or cramps, nausea, vomiting, bloating, diarrhea, constipation, hematemesis, melena, hematochezia  or hemorrhoids. Genitourinary: Denies dysuria, frequency, urgency, nocturia, hesitancy, discharge, hematuria or flank pain. Musculoskeletal: Denies arthralgias, myalgias, stiffness, jt. swelling, pain, limping or strain/sprain.  Skin: Denies pruritus, rash, hives, warts, acne, eczema or change in skin lesion(s). Neuro: No weakness, tremor, incoordination, spasms, paresthesia or pain. Psychiatric: Denies confusion, memory loss or sensory loss. Endo: Denies change in weight, skin or hair change.  Heme/Lymph: No excessive bleeding, bruising or enlarged lymph nodes.  Physical Exam  BP 130/72   Pulse 64   Temp 98 F (36.7 C)   Resp 16   Ht '5\' 11"'$  (1.803 m)   Wt 215 lb 12.8 oz (97.9 kg)   SpO2 98%   BMI 30.10 kg/m   Appears  well nourished, well groomed  and in no distress.  Eyes: PERRLA, EOMs, conjunctiva no swelling or erythema. Sinuses: No frontal/maxillary tenderness ENT/Mouth: EAC's clear, TM's nl w/o erythema, bulging. Nares clear w/o erythema, swelling, exudates. Oropharynx clear without erythema or exudates. Oral hygiene is good. Tongue normal, non obstructing. Hearing intact.  Neck: Supple. Thyroid not palpable. Car 2+/2+ without bruits, nodes or JVD. Chest: Respirations nl with BS clear & equal w/o rales, rhonchi, wheezing or stridor.  Cor: Heart sounds normal w/ regular rate and rhythm  without sig. murmurs, gallops, clicks or rubs. Peripheral pulses normal and equal  without edema.  Abdomen: Soft & bowel sounds normal. Non-tender w/o guarding, rebound, hernias, masses or organomegaly.  Lymphatics: Unremarkable.  Musculoskeletal: Full ROM all peripheral extremities, joint stability, 5/5 strength and normal gait.  Skin: Warm, dry without exposed rashes, lesions or ecchymosis apparent.  Neuro: Cranial nerves intact, reflexes equal bilaterally. Sensory-motor testing grossly intact. Tendon reflexes grossly intact.  Pysch: Alert & oriented x 3.  Insight and judgement nl & appropriate. No ideations.  Assessment and Plan:  1. Labile hypertension  - Continue medication, monitor blood pressure at home.  - Continue DASH diet.  Reminder to go to the ER if any CP,  SOB, nausea, dizziness, severe HA, changes vision/speech.   - CBC with Differential/Platelet - COMPLETE METABOLIC  PANEL WITH GFR - Magnesium - TSH  2. Hyperlipidemia, mixed  - Continue diet/meds, exercise,& lifestyle modifications.  - Continue monitor periodic cholesterol/liver & renal functions    - Lipid panel - TSH  3. Abnormal glucose  - Continue diet, exercise  - Lifestyle modifications.  - Monitor appropriate labs   - Hemoglobin A1c - Insulin, random  4. Vitamin D deficiency  - Continue supplementation   - VITAMIN D 25 Hydroxy   5. Medication management  - CBC with Differential/Platelet - COMPLETE METABOLIC PANEL WITH GFR - Magnesium - Lipid panel - TSH - Hemoglobin A1c - Insulin, random - VITAMIN D 25 Hydroxy          Discussed  regular exercise, BP monitoring, weight control to achieve/maintain BMI less than 25 and discussed med and SE's. Recommended labs to assess /monitor clinical status .  I discussed the assessment and treatment plan with the patient. The patient was provided an opportunity to ask questions and all were answered. The patient agreed with the plan and demonstrated an  understanding of the instructions.  I provided over 30 minutes of exam, counseling, chart review and  complex critical decision making.        The patient was advised to call back or seek an in-person evaluation if the symptoms worsen or if the condition fails to improve as anticipated.   Kirtland Bouchard, MD

## 2022-05-16 ENCOUNTER — Ambulatory Visit (INDEPENDENT_AMBULATORY_CARE_PROVIDER_SITE_OTHER): Payer: Medicare Other | Admitting: Internal Medicine

## 2022-05-16 ENCOUNTER — Encounter: Payer: Self-pay | Admitting: Internal Medicine

## 2022-05-16 VITALS — BP 130/72 | HR 64 | Temp 98.0°F | Resp 16 | Ht 71.0 in | Wt 215.8 lb

## 2022-05-16 DIAGNOSIS — R7309 Other abnormal glucose: Secondary | ICD-10-CM | POA: Diagnosis not present

## 2022-05-16 DIAGNOSIS — E559 Vitamin D deficiency, unspecified: Secondary | ICD-10-CM

## 2022-05-16 DIAGNOSIS — Z79899 Other long term (current) drug therapy: Secondary | ICD-10-CM

## 2022-05-16 DIAGNOSIS — E782 Mixed hyperlipidemia: Secondary | ICD-10-CM

## 2022-05-16 DIAGNOSIS — R0989 Other specified symptoms and signs involving the circulatory and respiratory systems: Secondary | ICD-10-CM

## 2022-05-16 MED ORDER — TERBINAFINE HCL 250 MG PO TABS: ORAL_TABLET | ORAL | 0 refills | Status: DC

## 2022-05-16 MED ORDER — DEXAMETHASONE 4 MG PO TABS: ORAL_TABLET | ORAL | 0 refills | Status: DC

## 2022-05-17 LAB — CBC WITH DIFFERENTIAL/PLATELET
Absolute Monocytes: 504 cells/uL (ref 200–950)
Basophils Absolute: 48 cells/uL (ref 0–200)
Basophils Relative: 0.9 %
Eosinophils Absolute: 90 cells/uL (ref 15–500)
Eosinophils Relative: 1.7 %
HCT: 40.6 % (ref 38.5–50.0)
Hemoglobin: 13.8 g/dL (ref 13.2–17.1)
Lymphs Abs: 1595 cells/uL (ref 850–3900)
MCH: 33.3 pg — ABNORMAL HIGH (ref 27.0–33.0)
MCHC: 34 g/dL (ref 32.0–36.0)
MCV: 98.1 fL (ref 80.0–100.0)
MPV: 11.1 fL (ref 7.5–12.5)
Monocytes Relative: 9.5 %
Neutro Abs: 3063 cells/uL (ref 1500–7800)
Neutrophils Relative %: 57.8 %
Platelets: 170 10*3/uL (ref 140–400)
RBC: 4.14 10*6/uL — ABNORMAL LOW (ref 4.20–5.80)
RDW: 13.1 % (ref 11.0–15.0)
Total Lymphocyte: 30.1 %
WBC: 5.3 10*3/uL (ref 3.8–10.8)

## 2022-05-17 LAB — COMPLETE METABOLIC PANEL WITH GFR
AG Ratio: 2.1 (calc) (ref 1.0–2.5)
ALT: 23 U/L (ref 9–46)
AST: 18 U/L (ref 10–35)
Albumin: 4.6 g/dL (ref 3.6–5.1)
Alkaline phosphatase (APISO): 71 U/L (ref 35–144)
BUN: 15 mg/dL (ref 7–25)
CO2: 27 mmol/L (ref 20–32)
Calcium: 9.6 mg/dL (ref 8.6–10.3)
Chloride: 104 mmol/L (ref 98–110)
Creat: 1.06 mg/dL (ref 0.70–1.35)
Globulin: 2.2 g/dL (calc) (ref 1.9–3.7)
Glucose, Bld: 88 mg/dL (ref 65–99)
Potassium: 4.4 mmol/L (ref 3.5–5.3)
Sodium: 140 mmol/L (ref 135–146)
Total Bilirubin: 0.6 mg/dL (ref 0.2–1.2)
Total Protein: 6.8 g/dL (ref 6.1–8.1)
eGFR: 77 mL/min/{1.73_m2} (ref >=60)

## 2022-05-17 LAB — INSULIN, RANDOM: Insulin: 11.3 u[IU]/mL

## 2022-05-17 LAB — VITAMIN D 25 HYDROXY (VIT D DEFICIENCY, FRACTURES): Vit D, 25-Hydroxy: 66 ng/mL (ref 30–100)

## 2022-05-17 LAB — TSH: TSH: 1.94 mIU/L (ref 0.40–4.50)

## 2022-05-17 LAB — HEMOGLOBIN A1C
Hgb A1c MFr Bld: 5.2 % of total Hgb (ref <5.7)
Mean Plasma Glucose: 103 mg/dL
eAG (mmol/L): 5.7 mmol/L

## 2022-05-17 LAB — LIPID PANEL
Cholesterol: 152 mg/dL (ref <200)
HDL: 50 mg/dL (ref >=40)
LDL Cholesterol (Calc): 78 mg/dL (calc)
Non-HDL Cholesterol (Calc): 102 mg/dL (calc) (ref <130)
Total CHOL/HDL Ratio: 3 (calc) (ref <5.0)
Triglycerides: 147 mg/dL (ref <150)

## 2022-05-17 LAB — MAGNESIUM: Magnesium: 2.1 mg/dL (ref 1.5–2.5)

## 2022-05-19 NOTE — Progress Notes (Signed)
<><><><><><><><><><><><><><><><><><><><><><><><><><><><><><><><><> <><><><><><><><><><><><><><><><><><><><><><><><><><><><><><><><><> -   Test results slightly outside the reference range are not unusual. If there is anything important, I will review this with you,  otherwise it is considered normal test values.  If you have further questions,  please do not hesitate to contact me at the office or via My Chart.  <><><><><><><><><><><><><><><><><><><><><><><><><><><><><><><><><> <><><><><><><><><><><><><><><><><><><><><><><><><><><><><><><><><>  -  Total Chol = 152  Great     But  LDL Chol = 78  much better, but is still a little high            (  Ideal or goal LDL is less than 70  !  )   - Please continue Rosuvastatin & Also Diet is Still very important   - Recommend a stricter LOW  cholesterol diet   - Cholesterol only comes from animal sources                                                                - ie. meat, dairy, egg yolks  - Eat all the vegetables you want.  - Avoid Meat, Avoid Meat,  Avoid Meat                                            - especially Red Meat - Beef AND Pork .  - Avoid cheese & dairy - milk & ice cream.     - Cheese is the most concentrated form of trans-fats which                                             is the worst thing to clog up our arteries.   - Veggie cheese is OK which can be found in the fresh  produce section at [employer] or [employer] or [employer]  <><><><><><><><><><><><><><><><><><><><><><><><><><><><><><><><><> <><><><><><><><><><><><><><><><><><><><><><><><><><><><><><><><><>  - A1c - Normal - No Diabetes  - Great  ! <><><><><><><><><><><><><><><><><><><><><><><><><><><><><><><><><> <><><><><><><><><><><><><><><><><><><><><><><><><><><><><><><><><>  - Vitamin D = 66 - Great  !   Please keep dose same   <><><><><><><><><><><><><><><><><><><><><><><><><><><><><><><><><> <><><><><><><><><><><><><><><><><><><><><><><><><><><><><><><><><>  - All Else - CBC - Kidneys - Electrolytes - Liver - Magnesium & Thyroid    - all  Normal / OK  <><><><><><><><><><><><><><><><><><><><><><><><><><><><><><><><><> <><><><><><><><><><><><><><><><><><><><><><><><><><><><><><><><><>

## 2022-07-26 ENCOUNTER — Other Ambulatory Visit: Payer: Self-pay | Admitting: Internal Medicine

## 2022-09-02 NOTE — Progress Notes (Unsigned)
Encounter for Medicare Annual Wellness Exam   Assessment and Plan:  Jeremy Fowler was seen today for annual exam.  Diagnoses and all orders for this visit:  Encounter for Medicare Annual Wellness Exam Yearly   Labile hypertension No medications at this time Monitor blood pressure at home; call if consistently over 130/80 Continue DASH diet.   Reminder to go to the ER if any CP, SOB, nausea, dizziness, severe HA, changes vision/speech, left arm numbness and tingling and jaw pain. -     CBC with Differential/Platelet -     COMPLETE METABOLIC PANEL WITH GFR -     TSH -     Magnesium  Hyperlipidemia, mixed Continue medications: Rosuvastatin '20mg'$  1/2 tab daily, higher dose causes myalgias Discussed dietary and exercise modifications Low fat diet -     Lipid panel  Abnormal glucose Discussed dietary and exercise modifications -     Hemoglobin A1c  Vitamin D deficiency Continue supplementation to maintain goal of 70-100 Taking Vitamin D taking multivitamin daily -     VITAMIN D 25 Hydroxy (Vit-D Deficiency, Fractures)  BPH with obstruction/lower urinary tract symptoms Doing well at this time Continue medications: Alfuzosin '10mg'$  Will continue to monitor   Smoker Cigars, intermittent  Medication Management - Magnesium  Erectile Dysfunction Viagra 50 mg 1 tab PO as needed   Continue diet and meds as discussed. Further disposition pending results of labs. Discussed med's effects and SE's.  Patient agrees with plan of care and opportunity to ask questions/voice concerns. Over 30 minutes of chart review, interview, exam, counseling, and critical decision making was performed.   Future Appointments  Date Time Provider [place]  09/03/2022  9:30 AM Alycia Rossetti, NP [facility] None  12/04/2022  9:00 AM Alycia Rossetti, NP [facility] None  03/05/2024 10:00 AM Alycia Rossetti, NP [facility] None    Plan:   During the course of the visit the patient was educated  and counseled about appropriate screening and preventive services including:   Pneumococcal vaccine  Prevnar 13 Influenza vaccine Td vaccine Screening electrocardiogram Bone densitometry screening Colorectal cancer screening Diabetes screening Glaucoma screening Nutrition counseling  Advanced directives: requested  ----------------------------------------------------------------------------------------------------------------------  HPI 66 y.o. male  presents for 3 month follow up on HTN, HLD, abnormal glucose, weight and vitamin D deficiency.   He donates blood and platelets routinely.  Reports last time was 2 weeks ago   He does workout regularly at the [facility].  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 He has had many stressors this past 6 weeks and is heading to [place] for a funeral.   Otherwise he does not have any health or medication concerns.  He is having erectile dysfunction, more maintaining an erection.   Continues to have right elbow pain- had previously done physical therapy and dry needling.   BMI is There is no height or weight on file to calculate BMI., he has been working on diet and exercise. Wt Readings from Last 3 Encounters:  05/16/22 215 lb 12.8 oz (97.9 kg)  11/13/21 216 lb 12.8 oz (98.3 kg)  11/13/20 221 lb (100.2 kg)    HTN predates 2003 His blood pressure has  been controlled at home 110-120/70's today their BP is   BP Readings from Last 3 Encounters:  05/16/22 130/72  11/13/21 128/66  11/13/20 128/74     He does workout. He denies any cardiac symptoms, chest pains, palpitations, shortness of breath, dizziness or lower  extremity edema.      He is on cholesterol medication Rosuvastatin '20mg'$  1/2 tab daily and denies myalgias, had myalgias on 20 mg daily. His cholesterol is not at goal. The cholesterol last visit was:   Lab Results  Component Value Date   CHOL 152 05/16/2022   HDL 50 05/16/2022    LDLCALC 78 05/16/2022   TRIG 147 05/16/2022   CHOLHDL 3.0 05/16/2022    He has been working on diet and exercise for prediabetes, and denies polydipsia, polyuria, visual disturbances, vomiting and weight loss. Last A1C in the office was:  Lab Results  Component Value Date   HGBA1C 5.2 05/16/2022   Patient is on Vitamin D supplement.   Lab Results  Component Value Date   VD25OH 66 05/16/2022       Current Medications:  Current Outpatient Medications on File Prior to Visit  Medication Sig   acyclovir (ZOVIRAX) 400 MG tablet TAKE 1 TABLET BY MOUTH TWICE DAILY PROPHYLAXIS  FOR  MOUTH  ULCERS   alfuzosin (UROXATRAL) 10 MG 24 hr tablet Take 1 tablet Daily for Prostate   Cholecalciferol (VITAMIN D) 50 MCG (2000 UT) tablet Take 2,000 Units by mouth daily.   clotrimazole-betamethasone (LOTRISONE) cream APPLY 1 CREAM TOPICALLY TWICE DAILY   dexamethasone (DECADRON) 4 MG tablet Take 1 tab 3 x day - 3 days, then 2 x day - 3 days, then 1 tab daily   diphenoxylate-atropine (LOMOTIL) 2.5-0.025 MG tablet Take 1 tablet by mouth 4 (four) times daily as needed for diarrhea or loose stools.   Eszopiclone 3 MG TABS Take 1 tablet at bedtime  ONLY if needed for Sleep  &  limit to 5 days /week to avoid Addiction & Dementia   hydrocortisone (ANUCORT-HC) 25 MG suppository INSERT 1 SUPPOSITORY RECTALLY 3 TO 4 TIMES DAILY   Multiple Vitamin (MULTIVITAMIN) tablet Take 1 tablet by mouth daily.   Omega-3 Fatty Acids (FISH OIL PO) Take 1 capsule by mouth daily.   PSYLLIUM PO Take by mouth.   rosuvastatin (CRESTOR) 20 MG tablet TAKE 1 TABLET BY MOUTH ONCE DAILY FOR CHOLESTEROL   sildenafil (VIAGRA) 50 MG tablet Take 1 tablet (50 mg total) by mouth as needed for erectile dysfunction.   terbinafine (LAMISIL) 250 MG tablet Take 1 tablet Daily for Nail Fungus   No current facility-administered medications on file prior to visit.    Allergies:  Allergies  Allergen Reactions   Sulfa Antibiotics      dizzy disoriented   Elemental Sulfur Other (See Comments)    disoriented     Medical History:  Past Medical History:  Diagnosis Date   Anal fissure 03/14/2016   Colon polyp 04-28-2002   Colonoscopy    HSV-2 infection    Hyperlipemia    Hypertension    Insomnia    Interstitial cystitis 03/05/2016   Prolapsed internal hemorrhoids, grade 2 08/04/2012   Grade 2 hemorrhoids  05/11/15 band ligation right posterior hemorrhoidal bundle 06/14/15 band ligation right anterior hemorrhoidal bundle 07/13/15 LL banded    Prostatitis     Family history- Reviewed and unchanged   Social history- Reviewed and unchanged   Names of Other Physician/Practitioners you currently use: 1. [facility] here for primary care 2. Eye Exam: Due for 2022 3. Dental Exam :Q57month  Patient Care Team: MUnk Pinto MD as PCP - General (Internal Medicine) YMarybelle Killings MD as Consulting Physician (Orthopedic Surgery) GCrista Luria MD as Consulting Physician (Dermatology) DMarlou SaGTonna Corner MD  as Consulting Physician (Orthopedic Surgery)   Screening Tests: Immunization History  Administered Date(s) Administered   Influenza-Unspecified 06/30/2018   Moderna Sars-Covid-2 Vaccination 12/16/2019, 01/14/2020, 08/08/2020   PPD Test 03/31/2018, 05/04/2019, 06/08/2020   Td 01/22/2007   Tdap 10/01/2017   Zoster Recombinat (Shingrix) 10/01/2017, 11/01/2017     Vaccinations: TD or Tdap: 2019  Influenza: 2019, declined  Pneumococcal: Discussed next year Shingrix: 2019, compelete   Preventative Care: Last colonoscopy: 2013, due 07/2022 Hep C screening (1945-1965): 03/2015 PSA Screening : today  MEDICARE WELLNESS OBJECTIVES: Physical activity:   Cardiac risk factors:   Depression/mood screen:      05/15/2022   10:30 PM  Depression screen PHQ 2/9  Decreased Interest 0  Down, Depressed, Hopeless 0  PHQ - 2 Score 0    ADLs:      No data to display            Cognitive Testing  Alert? Yes  Normal Appearance?Yes  Oriented to person? Yes  Place? Yes   Time? Yes  Recall of three objects?  Yes  Can perform simple calculations? Yes  Displays appropriate judgment?Yes  Can read the correct time from a watch face?Yes  EOL planning:      Review of Systems:  Review of Systems  Constitutional:  Negative for chills and fever.  HENT:  Negative for congestion, hearing loss, sinus pain, sore throat and tinnitus.   Eyes:  Negative for blurred vision and double vision.  Respiratory:  Negative for cough, hemoptysis, sputum production, shortness of breath and wheezing.   Cardiovascular:  Negative for chest pain, palpitations and leg swelling.  Gastrointestinal:  Negative for abdominal pain, constipation, diarrhea, heartburn, nausea and vomiting.  Genitourinary:  Negative for dysuria and urgency.       Gets up once a night to go to the bathroom  Musculoskeletal:  Negative for back pain, falls, joint pain, myalgias and neck pain.  Skin:  Negative for rash.  Neurological:  Negative for dizziness, tingling, tremors, weakness and headaches.  Endo/Heme/Allergies:  Does not bruise/bleed easily.  Psychiatric/Behavioral:  Negative for depression and suicidal ideas. The patient is not nervous/anxious and does not have insomnia.       Physical Exam: There were no vitals taken for this visit. Wt Readings from Last 3 Encounters:  05/16/22 215 lb 12.8 oz (97.9 kg)  11/13/21 216 lb 12.8 oz (98.3 kg)  11/13/20 221 lb (100.2 kg)   General Appearance: Well nourished, in no apparent distress. Eyes: PERRLA, EOMs, conjunctiva no swelling or erythema Sinuses: No Frontal/maxillary tenderness ENT/Mouth: Ext aud canals clear, TMs without erythema, bulging. No erythema, swelling, or exudate on post pharynx.  Tonsils not swollen or erythematous. Hearing normal.  Neck: Supple, thyroid normal.  Respiratory: Respiratory effort normal, BS equal bilaterally without rales,  rhonchi, wheezing or stridor.  Cardio: RRR with no MRGs. Brisk peripheral pulses without edema.  Abdomen: Soft, + BS.  Non tender, no guarding, rebound, hernias, masses. Lymphatics: Non tender without lymphadenopathy.  Musculoskeletal: Full ROM, 5/5 strength, Normal gait Skin: Warm, dry without rashes, lesions, ecchymosis.  Neuro: Cranial nerves intact. No cerebellar symptoms.  Psych: Awake and oriented X 3, normal affect, Insight and Judgment appropriate.       Medicare Attestation I have personally reviewed: The patient's medical and social history Their use of alcohol, tobacco or illicit drugs Their current medications and supplements The patient's functional ability including ADLs,fall risks, home safety risks, cognitive, and hearing and visual impairment Diet and physical activities Evidence  for depression or mood disorders  The patient's weight, height, BMI, and visual acuity have been recorded in the chart.  I have made referrals, counseling, and provided education to the patient based on review of the above and I have provided the patient with a written personalized care plan for preventive services.    Magda Bernheim ANP-C  [facility]  09/02/2022

## 2022-09-03 ENCOUNTER — Ambulatory Visit (INDEPENDENT_AMBULATORY_CARE_PROVIDER_SITE_OTHER): Payer: Medicare Other | Admitting: Nurse Practitioner

## 2022-09-03 ENCOUNTER — Encounter: Payer: Self-pay | Admitting: Nurse Practitioner

## 2022-09-03 VITALS — BP 128/82 | HR 62 | Temp 97.2°F | Ht 71.0 in | Wt 216.8 lb

## 2022-09-03 DIAGNOSIS — Z23 Encounter for immunization: Secondary | ICD-10-CM | POA: Diagnosis not present

## 2022-09-03 DIAGNOSIS — E559 Vitamin D deficiency, unspecified: Secondary | ICD-10-CM

## 2022-09-03 DIAGNOSIS — N401 Enlarged prostate with lower urinary tract symptoms: Secondary | ICD-10-CM

## 2022-09-03 DIAGNOSIS — E782 Mixed hyperlipidemia: Secondary | ICD-10-CM

## 2022-09-03 DIAGNOSIS — Z0001 Encounter for general adult medical examination with abnormal findings: Secondary | ICD-10-CM

## 2022-09-03 DIAGNOSIS — N528 Other male erectile dysfunction: Secondary | ICD-10-CM

## 2022-09-03 DIAGNOSIS — N138 Other obstructive and reflux uropathy: Secondary | ICD-10-CM

## 2022-09-03 DIAGNOSIS — Z0000 Encounter for general adult medical examination without abnormal findings: Secondary | ICD-10-CM

## 2022-09-03 DIAGNOSIS — R7309 Other abnormal glucose: Secondary | ICD-10-CM

## 2022-09-03 DIAGNOSIS — R0989 Other specified symptoms and signs involving the circulatory and respiratory systems: Secondary | ICD-10-CM | POA: Diagnosis not present

## 2022-09-03 DIAGNOSIS — F17200 Nicotine dependence, unspecified, uncomplicated: Secondary | ICD-10-CM

## 2022-09-03 DIAGNOSIS — R6889 Other general symptoms and signs: Secondary | ICD-10-CM

## 2022-09-03 DIAGNOSIS — Z79899 Other long term (current) drug therapy: Secondary | ICD-10-CM | POA: Diagnosis not present

## 2022-09-03 DIAGNOSIS — Z1211 Encounter for screening for malignant neoplasm of colon: Secondary | ICD-10-CM

## 2022-09-03 MED ORDER — ROSUVASTATIN CALCIUM 5 MG PO TABS: 5.0000 mg | ORAL_TABLET | Freq: Every day | ORAL | 11 refills | Status: DC

## 2022-09-03 NOTE — Patient Instructions (Signed)

## 2022-09-04 LAB — CBC WITH DIFFERENTIAL/PLATELET
Absolute Monocytes: 474 cells/uL (ref 200–950)
Basophils Absolute: 20 cells/uL (ref 0–200)
Basophils Relative: 0.4 %
Eosinophils Absolute: 92 cells/uL (ref 15–500)
Eosinophils Relative: 1.8 %
HCT: 44 % (ref 38.5–50.0)
Hemoglobin: 15.2 g/dL (ref 13.2–17.1)
Lymphs Abs: 1515 cells/uL (ref 850–3900)
MCH: 32.5 pg (ref 27.0–33.0)
MCHC: 34.5 g/dL (ref 32.0–36.0)
MCV: 94.2 fL (ref 80.0–100.0)
MPV: 11.2 fL (ref 7.5–12.5)
Monocytes Relative: 9.3 %
Neutro Abs: 2999 cells/uL (ref 1500–7800)
Neutrophils Relative %: 58.8 %
Platelets: 195 10*3/uL (ref 140–400)
RBC: 4.67 10*6/uL (ref 4.20–5.80)
RDW: 13.5 % (ref 11.0–15.0)
Total Lymphocyte: 29.7 %
WBC: 5.1 10*3/uL (ref 3.8–10.8)

## 2022-09-04 LAB — COMPLETE METABOLIC PANEL WITH GFR
AG Ratio: 2 (calc) (ref 1.0–2.5)
ALT: 21 U/L (ref 9–46)
AST: 19 U/L (ref 10–35)
Albumin: 4.8 g/dL (ref 3.6–5.1)
Alkaline phosphatase (APISO): 81 U/L (ref 35–144)
BUN: 15 mg/dL (ref 7–25)
CO2: 29 mmol/L (ref 20–32)
Calcium: 9.9 mg/dL (ref 8.6–10.3)
Chloride: 104 mmol/L (ref 98–110)
Creat: 0.96 mg/dL (ref 0.70–1.35)
Globulin: 2.4 g/dL (calc) (ref 1.9–3.7)
Glucose, Bld: 86 mg/dL (ref 65–99)
Potassium: 4.6 mmol/L (ref 3.5–5.3)
Sodium: 141 mmol/L (ref 135–146)
Total Bilirubin: 0.6 mg/dL (ref 0.2–1.2)
Total Protein: 7.2 g/dL (ref 6.1–8.1)
eGFR: 87 mL/min/{1.73_m2} (ref >=60)

## 2022-09-04 LAB — LIPID PANEL
Cholesterol: 173 mg/dL (ref <200)
HDL: 55 mg/dL (ref >=40)
LDL Cholesterol (Calc): 90 mg/dL (calc)
Non-HDL Cholesterol (Calc): 118 mg/dL (calc) (ref <130)
Total CHOL/HDL Ratio: 3.1 (calc) (ref <5.0)
Triglycerides: 179 mg/dL — ABNORMAL HIGH (ref <150)

## 2022-09-04 LAB — MAGNESIUM: Magnesium: 2.3 mg/dL (ref 1.5–2.5)

## 2022-11-13 ENCOUNTER — Encounter: Payer: Medicare Other | Admitting: Nurse Practitioner

## 2022-11-25 ENCOUNTER — Encounter: Payer: Self-pay | Admitting: Gastroenterology

## 2022-12-02 ENCOUNTER — Ambulatory Visit (AMBULATORY_SURGERY_CENTER): Payer: Medicare Other

## 2022-12-02 VITALS — Ht 71.0 in | Wt 215.0 lb

## 2022-12-02 DIAGNOSIS — Z86010 Personal history of colonic polyps: Secondary | ICD-10-CM

## 2022-12-02 DIAGNOSIS — Z800 Family history of malignant neoplasm of digestive organs: Secondary | ICD-10-CM

## 2022-12-02 MED ORDER — NA SULFATE-K SULFATE-MG SULF 17.5-3.13-1.6 GM/177ML PO SOLN: 1.0000 | Freq: Once | ORAL | 0 refills | Status: AC

## 2022-12-02 NOTE — Progress Notes (Signed)
No egg or soy allergy known to patient  No issues known to pt with past sedation with any surgeries or procedures Patient denies ever being told they had issues or difficulty with intubation  No FH of Malignant Hyperthermia Pt is not on diet pills Pt is not on  home 02  Pt is not on blood thinners  Pt denies issues with constipation  No A fib or A flutter Have any cardiac testing pending--noi Pt instructed to use Singlecare.com or GoodRx for a price reduction on prep

## 2022-12-03 ENCOUNTER — Encounter: Payer: Self-pay | Admitting: Gastroenterology

## 2022-12-03 NOTE — Progress Notes (Unsigned)
Complete Physical   Assessment and Plan:  Rykker was seen today for annual exam.  Diagnoses and all orders for this visit:  Encounter for general adult medical examination with abnormal findings Yearly -     CBC with Differential/Platelet -     COMPLETE METABOLIC PANEL WITH GFR -     TSH -     Magnesium  Labile hypertension No medications at this time Monitor blood pressure at home; call if consistently over 130/80 Continue DASH diet.   Reminder to go to the ER if any CP, SOB, nausea, dizziness, severe HA, changes vision/speech, left arm numbness and tingling and jaw pain. -     CBC with Differential/Platelet -     COMPLETE METABOLIC PANEL WITH GFR -     TSH -     Magnesium  Hyperlipidemia, mixed Continue medications: Rosuvastatin '20mg'$  1/2 tab daily, higher dose causes myalgias Discussed dietary and exercise modifications Low fat diet -     Lipid panel  Abnormal glucose Discussed dietary and exercise modifications -     Hemoglobin A1c  Vitamin D deficiency Continue supplementation to maintain goal of 70-100 Taking Vitamin D taking multivitamin daily -     VITAMIN D 25 Hydroxy (Vit-D Deficiency, Fractures)  BPH with obstruction/lower urinary tract symptoms Doing well at this time Continue medications: Alfuzosin '10mg'$  Will continue to monitor -     PSA  Smoker Cigars, intermittent  Screening, ischemic heart disease -     EKG 12-Lead  Screening for thyroid disorder      - TSH  Screening PSA (prostate specific antigen) -     PSA  Screening for blood or protein in urine -     Routine UA with reflex microscopic -  Microalbumin/Creatinine urine ratio  Medication Management - Magnesium   Erectile Dysfunction Viagra 50 mg 1 tab PO as needed   Continue diet and meds as discussed. Further disposition pending results of labs. Discussed med's effects and SE's.  Patient agrees with plan of care and opportunity to ask questions/voice concerns. Over 30 minutes  of chart review, interview, exam, counseling, and critical decision making was performed.   Future Appointments  Date Time Provider [place]  12/04/2022  9:00 AM Alycia Rossetti, NP [facility] None  12/31/2022 11:30 AM Thornton Park, MD [facility]  03/05/2024 10:00 AM Alycia Rossetti, NP [facility] None    ----------------------------------------------------------------------------------------------------------------------  HPI 67 y.o. male  presents for 3 month follow up on HTN, HLD, abnormal glucose, weight and vitamin D deficiency.   He donates blood and platelets routinely.  Reports last time was 2 weeks ago   He does workout regularly at the [facility].  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 He has had many stressors this past 6 weeks and is heading to [place] for a funeral.   Otherwise he does not have any health or medication concerns.  He is having erectile dysfunction, more maintaining an erection.   Continues to have right elbow pain- had previously done physical therapy and dry needling.   BMI is There is no height or weight on file to calculate BMI., he has been working on diet and exercise. Wt Readings from Last 3 Encounters:  12/02/22 215 lb (97.5 kg)  09/03/22 216 lb 12.8 oz (98.3 kg)  05/16/22 215 lb 12.8 oz (97.9 kg)    HTN predates 2003 His blood pressure has  been controlled at home 110-120/70's today their BP  is   BP Readings from Last 3 Encounters:  09/03/22 128/82  05/16/22 130/72  11/13/21 128/66     He does workout. He denies any cardiac symptoms, chest pains, palpitations, shortness of breath, dizziness or lower extremity edema.      He is on cholesterol medication Rosuvastatin '20mg'$  1/2 tab daily and denies myalgias, had myalgias on 20 mg daily. His cholesterol is not at goal. The cholesterol last visit was:   Lab Results  Component Value Date   CHOL 173 09/03/2022   HDL 55 09/03/2022    LDLCALC 90 09/03/2022   TRIG 179 (H) 09/03/2022   CHOLHDL 3.1 09/03/2022    He has been working on diet and exercise for prediabetes, and denies polydipsia, polyuria, visual disturbances, vomiting and weight loss. Last A1C in the office was:  Lab Results  Component Value Date   HGBA1C 5.2 05/16/2022   Patient is on Vitamin D supplement.   Lab Results  Component Value Date   VD25OH 66 05/16/2022       Current Medications:  Current Outpatient Medications on File Prior to Visit  Medication Sig   acyclovir (ZOVIRAX) 400 MG tablet TAKE 1 TABLET BY MOUTH TWICE DAILY PROPHYLAXIS  FOR  MOUTH  ULCERS   alfuzosin (UROXATRAL) 10 MG 24 hr tablet Take 1 tablet Daily for Prostate (Patient not taking: Reported on 12/02/2022)   Cholecalciferol (VITAMIN D) 50 MCG (2000 UT) tablet Take 2,000 Units by mouth daily.   clotrimazole-betamethasone (LOTRISONE) cream APPLY 1 CREAM TOPICALLY TWICE DAILY (Patient not taking: Reported on 12/02/2022)   dexamethasone (DECADRON) 4 MG tablet Take 1 tab 3 x day - 3 days, then 2 x day - 3 days, then 1 tab daily (Patient not taking: Reported on 12/02/2022)   Eszopiclone 3 MG TABS Take 1 tablet at bedtime  ONLY if needed for Sleep  &  limit to 5 days /week to avoid Addiction & Dementia (Patient not taking: Reported on 12/02/2022)   hydrocortisone (ANUCORT-HC) 25 MG suppository INSERT 1 SUPPOSITORY RECTALLY 3 TO 4 TIMES DAILY (Patient not taking: Reported on 12/02/2022)   Multiple Vitamin (MULTIVITAMIN) tablet Take 1 tablet by mouth daily.   Omega-3 Fatty Acids (FISH OIL PO) Take 1 capsule by mouth daily.   PSYLLIUM PO Take by mouth.   rosuvastatin (CRESTOR) 5 MG tablet Take 1 tablet (5 mg total) by mouth daily.   sildenafil (VIAGRA) 50 MG tablet Take 1 tablet (50 mg total) by mouth as needed for erectile dysfunction.   terbinafine (LAMISIL) 250 MG tablet Take 1 tablet Daily for Nail Fungus (Patient not taking: Reported on 12/02/2022)   No current facility-administered medications  on file prior to visit.    Allergies:  Allergies  Allergen Reactions   Sulfa Antibiotics     dizzy disoriented   Elemental Sulfur Other (See Comments)    disoriented   Sulfur Other (See Comments)    disoriented, disoriented     Medical History:  Past Medical History:  Diagnosis Date   Anal fissure 03/14/2016   Colon polyp 04-28-2002   Colonoscopy    HSV-2 infection    Hyperlipemia    Hypertension    Insomnia    Interstitial cystitis 03/05/2016   Prolapsed internal hemorrhoids, grade 2 08/04/2012   Grade 2 hemorrhoids  05/11/15 band ligation right posterior hemorrhoidal bundle 06/14/15 band ligation right anterior hemorrhoidal bundle 07/13/15 LL banded    Prostatitis     Family history- Reviewed and unchanged   Social history- Reviewed and  unchanged   Names of Other Physician/Practitioners you currently use: 1. [facility] here for primary care 2. Eye Exam: Due for 2022 3. Dental Exam :Q34month  Patient Care Team: MUnk Pinto MD as PCP - General (Internal Medicine) YMarybelle Killings MD as Consulting Physician (Orthopedic Surgery) GCrista Luria MD as Consulting Physician (Dermatology) DMarlou SaGTonna Corner MD as Consulting Physician (Orthopedic Surgery)   Screening Tests: Immunization History  Administered Date(s) Administered   Influenza-Unspecified 06/30/2018, 08/05/2022   Moderna Sars-Covid-2 Vaccination 12/16/2019, 01/14/2020, 08/08/2020   PFIZER(Purple Top)SARS-COV-2 Vaccination 08/05/2022   PNEUMOCOCCAL CONJUGATE-20 09/03/2022   PPD Test 03/31/2018, 05/04/2019, 06/08/2020   Td 01/22/2007   Tdap 10/01/2017   Zoster Recombinat (Shingrix) 10/01/2017, 11/01/2017     Vaccinations: TD or Tdap: 2019  Influenza: 2019, declined  Pneumococcal: Discussed next year Shingrix: 2019, compelete   Preventative Care: Last colonoscopy: 2013, due 07/2022 Hep C screening (1945-1965): 03/2015 PSA Screening : today   Review of  Systems:  Review of Systems  Constitutional:  Negative for chills and fever.  HENT:  Negative for congestion, hearing loss, sinus pain, sore throat and tinnitus.   Eyes:  Negative for blurred vision and double vision.  Respiratory:  Negative for cough, hemoptysis, sputum production, shortness of breath and wheezing.   Cardiovascular:  Negative for chest pain, palpitations and leg swelling.  Gastrointestinal:  Negative for abdominal pain, constipation, diarrhea, heartburn, nausea and vomiting.  Genitourinary:  Negative for dysuria and urgency.       Gets up once a night to go to the bathroom  Musculoskeletal:  Negative for back pain, falls, joint pain, myalgias and neck pain.  Skin:  Negative for rash.  Neurological:  Negative for dizziness, tingling, tremors, weakness and headaches.  Endo/Heme/Allergies:  Does not bruise/bleed easily.  Psychiatric/Behavioral:  Negative for depression and suicidal ideas. The patient is not nervous/anxious and does not have insomnia.       Physical Exam: There were no vitals taken for this visit. Wt Readings from Last 3 Encounters:  12/02/22 215 lb (97.5 kg)  09/03/22 216 lb 12.8 oz (98.3 kg)  05/16/22 215 lb 12.8 oz (97.9 kg)   General Appearance: Well nourished, in no apparent distress. Eyes: PERRLA, EOMs, conjunctiva no swelling or erythema Sinuses: No Frontal/maxillary tenderness ENT/Mouth: Ext aud canals clear, TMs without erythema, bulging. No erythema, swelling, or exudate on post pharynx.  Tonsils not swollen or erythematous. Hearing normal.  Neck: Supple, thyroid normal.  Respiratory: Respiratory effort normal, BS equal bilaterally without rales, rhonchi, wheezing or stridor.  Cardio: RRR with no MRGs. Brisk peripheral pulses without edema.  Abdomen: Soft, + BS.  Non tender, no guarding, rebound, hernias, masses. Lymphatics: Non tender without lymphadenopathy.  Musculoskeletal: Full ROM, 5/5 strength, Normal gait Skin: Warm, dry without  rashes, lesions, ecchymosis.  Neuro: Cranial nerves intact. No cerebellar symptoms.  Psych: Awake and oriented X 3, normal affect, Insight and Judgment appropriate.   EKG: NSR, no ST changes   DMarda St[facility]  12/03/2022

## 2022-12-04 ENCOUNTER — Ambulatory Visit (INDEPENDENT_AMBULATORY_CARE_PROVIDER_SITE_OTHER): Payer: Medicare Other | Admitting: Nurse Practitioner

## 2022-12-04 ENCOUNTER — Encounter: Payer: Self-pay | Admitting: Nurse Practitioner

## 2022-12-04 VITALS — BP 138/72 | HR 65 | Temp 97.5°F | Ht 71.0 in | Wt 224.0 lb

## 2022-12-04 DIAGNOSIS — M7061 Trochanteric bursitis, right hip: Secondary | ICD-10-CM

## 2022-12-04 DIAGNOSIS — I440 Atrioventricular block, first degree: Secondary | ICD-10-CM

## 2022-12-04 DIAGNOSIS — Z1329 Encounter for screening for other suspected endocrine disorder: Secondary | ICD-10-CM

## 2022-12-04 DIAGNOSIS — E782 Mixed hyperlipidemia: Secondary | ICD-10-CM | POA: Diagnosis not present

## 2022-12-04 DIAGNOSIS — Z136 Encounter for screening for cardiovascular disorders: Secondary | ICD-10-CM | POA: Diagnosis not present

## 2022-12-04 DIAGNOSIS — N528 Other male erectile dysfunction: Secondary | ICD-10-CM

## 2022-12-04 DIAGNOSIS — R7309 Other abnormal glucose: Secondary | ICD-10-CM

## 2022-12-04 DIAGNOSIS — E559 Vitamin D deficiency, unspecified: Secondary | ICD-10-CM

## 2022-12-04 DIAGNOSIS — Z125 Encounter for screening for malignant neoplasm of prostate: Secondary | ICD-10-CM

## 2022-12-04 DIAGNOSIS — I700 Atherosclerosis of aorta: Secondary | ICD-10-CM

## 2022-12-04 DIAGNOSIS — Z0000 Encounter for general adult medical examination without abnormal findings: Secondary | ICD-10-CM | POA: Diagnosis not present

## 2022-12-04 DIAGNOSIS — Z1389 Encounter for screening for other disorder: Secondary | ICD-10-CM

## 2022-12-04 DIAGNOSIS — E669 Obesity, unspecified: Secondary | ICD-10-CM

## 2022-12-04 DIAGNOSIS — Z0001 Encounter for general adult medical examination with abnormal findings: Secondary | ICD-10-CM

## 2022-12-04 DIAGNOSIS — Z79899 Other long term (current) drug therapy: Secondary | ICD-10-CM

## 2022-12-04 DIAGNOSIS — N138 Other obstructive and reflux uropathy: Secondary | ICD-10-CM

## 2022-12-04 DIAGNOSIS — F17200 Nicotine dependence, unspecified, uncomplicated: Secondary | ICD-10-CM

## 2022-12-04 DIAGNOSIS — R0989 Other specified symptoms and signs involving the circulatory and respiratory systems: Secondary | ICD-10-CM | POA: Diagnosis not present

## 2022-12-04 DIAGNOSIS — N401 Enlarged prostate with lower urinary tract symptoms: Secondary | ICD-10-CM | POA: Diagnosis not present

## 2022-12-04 MED ORDER — PREDNISONE 20 MG PO TABS: ORAL_TABLET | ORAL | 0 refills | Status: AC

## 2022-12-04 MED ORDER — MELOXICAM 15 MG PO TBDP: 1.0000 | ORAL_TABLET | Freq: Every day | ORAL | 0 refills | Status: DC

## 2022-12-04 NOTE — Patient Instructions (Signed)
Tendinitis  Tendinitis is inflammation of a tendon. A tendon is a strong cord of tissue that connects muscle to bone. Tendinitis can affect any tendon, but it most commonly affects the: What are the causes? This condition may be caused by: Overusing a tendon or muscle. This is the most common cause. Age-related wear and tear. Injury. Inflammatory conditions, such as arthritis. Certain medicines. What increases the risk? You are more likely to develop this condition if you do activities that involve the same movements over and over again (repetitive motions). What are the signs or symptoms? Symptoms of this condition may include: Pain. Tenderness. Mild swelling. Decreased range of motion. How is this diagnosed? This condition is diagnosed with a physical exam. You may also have tests, such as: Ultrasound. This uses sound waves to make an image of the inside of your body in the affected area. MRI. This uses magnetic fields and radio waves to make an image of the inside of your body in the affected area. How is this treated? This condition may be treated by resting, icing, applying pressure (compression), and raising (elevating) the affected area above the level of your heart. This is known as RICE therapy. Treatment may also include: Medicines to help reduce inflammation or to help reduce pain. Exercises or physical therapy to improve movement and strength of the tendon. A brace or splint. Injection of corticosteroid medicine. This may be done in some cases. Surgery. This is rarely needed and only used if all other treatment has failed. Follow these instructions at home: If you have a removable splint or brace: Wear the splint or brace as told by your health care provider. Remove it only as told by your health care provider. Check the skin around the splint or brace every day. Tell your health care provider about any concerns. Loosen the splint or brace if your fingers or toes tingle,  become numb, or turn cold and blue. Keep the splint or brace clean. If the splint or brace is not waterproof: Do not let it get wet. Cover it with a watertight covering when you take a bath or shower, or remove it as told by your health care provider. Managing pain, stiffness, and swelling     If directed, put ice on the affected area. To do this: If you have a removable splint or brace, remove it as told by your health care provider. Put ice in a plastic bag. Place a towel between your skin and the bag. Leave the ice on for 20 minutes, 2-3 times a day. Remove the ice if your skin turns bright red. This is very important. If you cannot feel pain, heat, or cold, you have a greater risk of damage to the area. Move the fingers or toes of the affected limb often, if this applies. This can help to reduce stiffness and swelling. If directed, elevate the affected area above the level of your heart while you are sitting or lying down. If directed, apply heat to the affected area before you exercise. Use the heat source that your health care provider recommends, such as a moist heat pack or a heating pad. Place a towel between your skin and the heat source. Leave the heat on for 20-30 minutes. Remove the heat if your skin turns bright red. This is especially important if you are unable to feel pain, heat, or cold. You have a greater risk of getting burned. Activity Rest the affected area as told by your health care provider.  Ask your health care provider when it is safe to drive if you have a splint or brace on any part of your arm or leg. Return to your normal activities as told by your health care provider. Ask your health care provider what activities are safe for you. Avoid using the affected area while you are experiencing symptoms of tendinitis. Do exercises as told by your health care provider. General instructions Wear an elastic bandage or compression wrap only as told by your health care  provider. Take over-the-counter and prescription medicines only as told by your health care provider. Keep all follow-up visits. This is important. Contact a health care provider if: Your symptoms do not improve. You develop new, unexplained problems, such as numbness in your hands or feet. Summary Tendinitis is inflammation of a tendon. You are more likely to develop this condition if you do activities that involve the same movements over and over again. This condition may be treated by resting, icing, applying pressure (compression), and elevating the area above the level of your heart. This is known as RICE therapy. Avoid using the affected area while you are experiencing symptoms of tendinitis. This information is not intended to replace advice given to you by your health care provider. Make sure you discuss any questions you have with your health care provider. Document Revised: 05/24/2021 Document Reviewed: 05/24/2021 Elsevier Patient Education  [place].

## 2022-12-05 LAB — CBC WITH DIFFERENTIAL/PLATELET
Absolute Monocytes: 495 cells/uL (ref 200–950)
Basophils Absolute: 33 cells/uL (ref 0–200)
Basophils Relative: 0.6 %
Eosinophils Absolute: 61 cells/uL (ref 15–500)
Eosinophils Relative: 1.1 %
HCT: 43.8 % (ref 38.5–50.0)
Hemoglobin: 14.7 g/dL (ref 13.2–17.1)
Lymphs Abs: 1485 cells/uL (ref 850–3900)
MCH: 32 pg (ref 27.0–33.0)
MCHC: 33.6 g/dL (ref 32.0–36.0)
MCV: 95.2 fL (ref 80.0–100.0)
MPV: 10.9 fL (ref 7.5–12.5)
Monocytes Relative: 9 %
Neutro Abs: 3427 cells/uL (ref 1500–7800)
Neutrophils Relative %: 62.3 %
Platelets: 190 10*3/uL (ref 140–400)
RBC: 4.6 10*6/uL (ref 4.20–5.80)
RDW: 14.1 % (ref 11.0–15.0)
Total Lymphocyte: 27 %
WBC: 5.5 10*3/uL (ref 3.8–10.8)

## 2022-12-05 LAB — COMPLETE METABOLIC PANEL WITH GFR
AG Ratio: 1.8 (calc) (ref 1.0–2.5)
ALT: 28 U/L (ref 9–46)
AST: 22 U/L (ref 10–35)
Albumin: 4.6 g/dL (ref 3.6–5.1)
Alkaline phosphatase (APISO): 80 U/L (ref 35–144)
BUN: 17 mg/dL (ref 7–25)
CO2: 28 mmol/L (ref 20–32)
Calcium: 10 mg/dL (ref 8.6–10.3)
Chloride: 103 mmol/L (ref 98–110)
Creat: 0.98 mg/dL (ref 0.70–1.35)
Globulin: 2.6 g/dL (calc) (ref 1.9–3.7)
Glucose, Bld: 88 mg/dL (ref 65–99)
Potassium: 4.3 mmol/L (ref 3.5–5.3)
Sodium: 140 mmol/L (ref 135–146)
Total Bilirubin: 0.7 mg/dL (ref 0.2–1.2)
Total Protein: 7.2 g/dL (ref 6.1–8.1)
eGFR: 85 mL/min/{1.73_m2} (ref >=60)

## 2022-12-05 LAB — URINALYSIS, ROUTINE W REFLEX MICROSCOPIC
Bacteria, UA: NONE SEEN /HPF
Bilirubin Urine: NEGATIVE
Glucose, UA: NEGATIVE
Hgb urine dipstick: NEGATIVE
Hyaline Cast: NONE SEEN /LPF
Ketones, ur: NEGATIVE
Nitrite: NEGATIVE
Protein, ur: NEGATIVE
RBC / HPF: NONE SEEN /HPF (ref 0–2)
Specific Gravity, Urine: 1.007 (ref 1.001–1.035)
Squamous Epithelial / HPF: NONE SEEN /HPF (ref <=5)
WBC, UA: NONE SEEN /HPF (ref 0–5)
pH: 7 (ref 5.0–8.0)

## 2022-12-05 LAB — LIPID PANEL
Cholesterol: 184 mg/dL (ref <200)
HDL: 51 mg/dL (ref >=40)
LDL Cholesterol (Calc): 101 mg/dL (calc) — ABNORMAL HIGH
Non-HDL Cholesterol (Calc): 133 mg/dL (calc) — ABNORMAL HIGH (ref <130)
Total CHOL/HDL Ratio: 3.6 (calc) (ref <5.0)
Triglycerides: 197 mg/dL — ABNORMAL HIGH (ref <150)

## 2022-12-05 LAB — TSH: TSH: 1.73 mIU/L (ref 0.40–4.50)

## 2022-12-05 LAB — MICROSCOPIC MESSAGE

## 2022-12-05 LAB — VITAMIN D 25 HYDROXY (VIT D DEFICIENCY, FRACTURES): Vit D, 25-Hydroxy: 55 ng/mL (ref 30–100)

## 2022-12-05 LAB — MAGNESIUM: Magnesium: 2.1 mg/dL (ref 1.5–2.5)

## 2022-12-05 LAB — PSA: PSA: 1.54 ng/mL (ref <=4.00)

## 2022-12-05 LAB — HEMOGLOBIN A1C
Hgb A1c MFr Bld: 5.5 % of total Hgb (ref <5.7)
Mean Plasma Glucose: 111 mg/dL
eAG (mmol/L): 6.2 mmol/L

## 2022-12-05 LAB — MICROALBUMIN / CREATININE URINE RATIO
Creatinine, Urine: 31 mg/dL (ref 20–320)
Microalb, Ur: <0.2 mg/dL

## 2022-12-31 ENCOUNTER — Ambulatory Visit (AMBULATORY_SURGERY_CENTER): Payer: Medicare Other | Admitting: Gastroenterology

## 2022-12-31 ENCOUNTER — Encounter: Payer: Self-pay | Admitting: Gastroenterology

## 2022-12-31 VITALS — BP 137/77 | HR 58 | Temp 97.5°F | Resp 12 | Ht 71.0 in | Wt 215.0 lb

## 2022-12-31 DIAGNOSIS — Z86010 Personal history of colonic polyps: Secondary | ICD-10-CM | POA: Diagnosis not present

## 2022-12-31 DIAGNOSIS — D123 Benign neoplasm of transverse colon: Secondary | ICD-10-CM | POA: Diagnosis not present

## 2022-12-31 DIAGNOSIS — Z09 Encounter for follow-up examination after completed treatment for conditions other than malignant neoplasm: Secondary | ICD-10-CM | POA: Diagnosis not present

## 2022-12-31 DIAGNOSIS — Z800 Family history of malignant neoplasm of digestive organs: Secondary | ICD-10-CM

## 2022-12-31 DIAGNOSIS — D128 Benign neoplasm of rectum: Secondary | ICD-10-CM | POA: Diagnosis not present

## 2022-12-31 MED ORDER — SODIUM CHLORIDE 0.9 % IV SOLN: 500.0000 mL | Freq: Once | INTRAVENOUS | Status: DC

## 2022-12-31 NOTE — Patient Instructions (Signed)
YOU HAD AN ENDOSCOPIC PROCEDURE TODAY AT [facility]:   Refer to the procedure report that was given to you for any specific questions about what was found during the examination.  If the procedure report does not answer your questions, please call your gastroenterologist to clarify.  If you requested that your care partner not be given the details of your procedure findings, then the procedure report has been included in a sealed envelope for you to review at your convenience later.  YOU SHOULD EXPECT: Some feelings of bloating in the abdomen. Passage of more gas than usual.  Walking can help get rid of the air that was put into your GI tract during the procedure and reduce the bloating. If you had a lower endoscopy (such as a colonoscopy or flexible sigmoidoscopy) you may notice spotting of blood in your stool or on the toilet paper. If you underwent a bowel prep for your procedure, you may not have a normal bowel movement for a few days.  Please Note:  You might notice some irritation and congestion in your nose or some drainage.  This is from the oxygen used during your procedure.  There is no need for concern and it should clear up in a day or so.  SYMPTOMS TO REPORT IMMEDIATELY:  Following lower endoscopy (colonoscopy or flexible sigmoidoscopy):  Excessive amounts of blood in the stool  Significant tenderness or worsening of abdominal pains  Swelling of the abdomen that is new, acute  Fever of 100F or higher   For urgent or emergent issues, a gastroenterologist can be reached at any hour by calling [phone]. Do not use MyChart messaging for urgent concerns.    DIET:  We do recommend a small meal at first, but then you may proceed to your regular diet.  Drink plenty of fluids but you should avoid alcoholic beverages for 24 hours.  Emerging evidence supports eating a diet of fruits, vegetables, grains, calcium, and yogurt while reducing red meat and alcohol may reduce  the risk of colon cancer.  MEDICATIONS: Continue present medications.  FOLLOW UP: Await pathology results. Repeat colonoscopy date to be determined after pending pathology results are reviewed for surveillance.  Thank you for allowing [place] to provide for your healthcare needs today.  ACTIVITY:  You should plan to take it easy for the rest of today and you should NOT DRIVE or use heavy machinery until tomorrow (because of the sedation medicines used during the test).    FOLLOW UP: Our staff will call the number listed on your records the next business day following your procedure.  We will call around 7:15- 8:00 am to check on you and address any questions or concerns that you may have regarding the information given to you following your procedure. If we do not reach you, we will leave a message.     If any biopsies were taken you will be contacted by phone or by letter within the next 1-3 weeks.  Please call us at [phone] if you have not heard about the biopsies in 3 weeks.    SIGNATURES/CONFIDENTIALITY: You and/or your care partner have signed paperwork which will be entered into your electronic medical record.  These signatures attest to the fact that that the information above on your After Visit Summary has been reviewed and is understood.  Full responsibility of the confidentiality of this discharge information lies with you and/or your care-partner.

## 2022-12-31 NOTE — Progress Notes (Signed)
Called to room to assist during endoscopic procedure.  Patient ID and intended procedure confirmed with present staff. Received instructions for my participation in the procedure from the performing physician.  

## 2022-12-31 NOTE — Progress Notes (Signed)
Pt's states no medical or surgical changes since previsit or office visit. 

## 2022-12-31 NOTE — Progress Notes (Signed)
Referring Provider: Unk Pinto, MD Primary Care Physician:  Unk Pinto, MD  Indication for Procedure:  Colon cancer Surveillance   IMPRESSION:  Need for colon cancer surveillance Appropriate candidate for monitored anesthesia care  PLAN: Colonoscopy in the [place] today   HPI: Jeremy Fowler is a 67 y.o. male presents for surveillance colonoscopy.  Prior endoscopic history: Colonoscopy 2003: hyperplastic colon polyps Colonoscopy 2008: normal Colonoscopy 2013: 3 small tubular adenomas, internal hemorrhoids  No baseline GI symptoms.   Maternal grandmother with colon cancer in her 36s. No known family history of colon cancer or polyps. No family history of uterine/endometrial cancer, pancreatic cancer or gastric/stomach cancer.   Past Medical History:  Diagnosis Date   Anal fissure 03/14/2016   Colon polyp 04-28-2002   Colonoscopy    HSV-2 infection    Hyperlipemia    Hypertension    Insomnia    Interstitial cystitis 03/05/2016   Prolapsed internal hemorrhoids, grade 2 08/04/2012   Grade 2 hemorrhoids  05/11/15 band ligation right posterior hemorrhoidal bundle 06/14/15 band ligation right anterior hemorrhoidal bundle 07/13/15 LL banded    Prostatitis     Past Surgical History:  Procedure Laterality Date   CALCANEAL OSTEOTOMY W/ INTERNAL FIXATION     COLONOSCOPY  08/04/2012   Procedure: COLONOSCOPY;  Surgeon: Inda Castle, MD;  Location: [facility];  Service: Endoscopy;  Laterality: N/A;  with possible banding   HEMORRHOID BANDING     HEMORRHOID BANDING  09/30/2014   TRICEPS TENDON REPAIR  01/2019    Current Outpatient Medications  Medication Sig Dispense Refill   acyclovir (ZOVIRAX) 400 MG tablet TAKE 1 TABLET BY MOUTH TWICE DAILY PROPHYLAXIS  FOR  MOUTH  ULCERS 180 tablet 0   Cholecalciferol (VITAMIN D) 50 MCG (2000 UT) tablet Take 2,000 Units by mouth daily.     Eszopiclone 3 MG TABS Take 1 tablet at bedtime  ONLY if needed for Sleep  &  limit to 5 days  /week to avoid Addiction & Dementia 30 tablet 0   Meloxicam 15 MG TBDP Take 1 tablet by mouth daily. 30 tablet 0   Multiple Vitamin (MULTIVITAMIN) tablet Take 1 tablet by mouth daily.     Omega-3 Fatty Acids (FISH OIL PO) Take 1 capsule by mouth daily.     PSYLLIUM PO Take by mouth.     rosuvastatin (CRESTOR) 5 MG tablet Take 1 tablet (5 mg total) by mouth daily. 30 tablet 11   sildenafil (VIAGRA) 50 MG tablet Take 1 tablet (50 mg total) by mouth as needed for erectile dysfunction. 20 tablet 1   No current facility-administered medications for this visit.    Allergies as of 12/31/2022 - Review Complete 12/04/2022  Allergen Reaction Noted   Sulfa antibiotics  08/04/2012   Elemental sulfur Other (See Comments) 03/18/2016   Sulfur Other (See Comments) 07/23/2015    Family History  Problem Relation Age of Onset   Cancer Mother    Colon cancer Maternal Grandmother        dx in her late 31's   Colon polyps Neg Hx    Esophageal cancer Neg Hx    Rectal cancer Neg Hx    Stomach cancer Neg Hx      Physical Exam: General:   Alert,  well-nourished, pleasant and cooperative in NAD Head:  Normocephalic and atraumatic. Eyes:  Sclera clear, no icterus.   Conjunctiva pink. Mouth:  No deformity or lesions.   Neck:  Supple; no masses or thyromegaly. Lungs:  Clear  throughout to auscultation.   No wheezes. Heart:  Regular rate and rhythm; no murmurs. Abdomen:  Soft, non-tender, nondistended, normal bowel sounds, no rebound or guarding.  Msk:  Symmetrical. No boney deformities LAD: No inguinal or umbilical LAD Extremities:  No clubbing or edema. Neurologic:  Alert and  oriented x4;  grossly nonfocal Skin:  No obvious rash or bruise. Psych:  Alert and cooperative. Normal mood and affect.     Studies/Results: No results found.    Jeremy Fowler L. Tarri Glenn, MD, MPH 12/31/2022, 10:21 AM

## 2022-12-31 NOTE — Progress Notes (Signed)
Sedate, gd SR's, VSS, report to RN 

## 2022-12-31 NOTE — Op Note (Signed)
[place] Patient Name: Jeremy Fowler Procedure Date: 12/31/2022 11:06 AM MRN: [id number] Endoscopist: Thornton Park MD, MD, [id number] Age: 67 Referring MD:  Date of Birth: 03-28-1956 Gender: Male Account #: [id number] Procedure:                Colonoscopy Indications:              High risk colon cancer surveillance: Personal                            history of multiple (3 or more) adenomas                           Colonoscopy 2003: hyperplastic colon polyps                           Colonoscopy 2008: normal                           Colonoscopy 2013: 3 small tubular adenomas,                            internal hemorrhoids Medicines:                Monitored Anesthesia Care Procedure:                Pre-Anesthesia Assessment:                           - Prior to the procedure, a History and Physical                            was performed, and patient medications and                            allergies were reviewed. The patient's tolerance of                            previous anesthesia was also reviewed. The risks                            and benefits of the procedure and the sedation                            options and risks were discussed with the patient.                            All questions were answered, and informed consent                            was obtained. Prior Anticoagulants: The patient has                            taken no anticoagulant or antiplatelet agents. ASA                            Grade Assessment:  III - A patient with severe                            systemic disease. After reviewing the risks and                            benefits, the patient was deemed in satisfactory                            condition to undergo the procedure.                           After obtaining informed consent, the colonoscope                            was passed under direct vision. Throughout the                            procedure,  the patient's blood pressure, pulse, and                            oxygen saturations were monitored continuously. The                            Olympus CF-HQ190L ([id number]) Colonoscope was                            introduced through the anus and advanced to the 3                            cm into the ileum. A second forward view of the                            right colon was performed. The colonoscopy was                            performed without difficulty. The patient tolerated                            the procedure well. The quality of the bowel                            preparation was good. The terminal ileum, ileocecal                            valve, appendiceal orifice, and rectum were                            photographed. Scope In: 11:20:42 AM Scope Out: 11:39:41 AM Scope Withdrawal Time: 0 hours 17 minutes 10 seconds  Total Procedure Duration: 0 hours 18 minutes 59 seconds  Findings:                 The perianal and digital rectal examinations were  normal.                           Non-bleeding internal hemorrhoids were found.                           A 10 mm polyp was found in the proximal transverse                            colon. The polyp was sessile. The polyp was removed                            with a cold snare. Resection and retrieval were                            complete. Estimated blood loss was minimal.                           Two flat polyps were found in the rectum. The                            polyps were 2 to 3 mm in size. These polyps were                            removed with a cold snare. Resection and retrieval                            were complete. Estimated blood loss was minimal.                           The exam was otherwise without abnormality on                            direct and retroflexion views. Complications:            No immediate complications. Estimated Blood Loss:      Estimated blood loss was minimal. Impression:               - Non-bleeding internal hemorrhoids.                           - One 10 mm polyp in the proximal transverse colon,                            removed with a cold snare. Resected and retrieved.                           - Two 2 to 3 mm polyps in the rectum, removed with                            a cold snare. Resected and retrieved.                           - The examination was otherwise normal on  direct                            and retroflexion views. Recommendation:           - Patient has a contact number available for                            emergencies. The signs and symptoms of potential                            delayed complications were discussed with the                            patient. Return to normal activities tomorrow.                            Written discharge instructions were provided to the                            patient.                           - Resume previous diet.                           - Continue present medications.                           - Await pathology results.                           - Repeat colonoscopy date to be determined after                            pending pathology results are reviewed for                            surveillance.                           - Emerging evidence supports eating a diet of                            fruits, vegetables, grains, calcium, and yogurt                            while reducing red meat and alcohol may reduce the                            risk of colon cancer.                           - Thank you for allowing me to be involved in your                            colon cancer prevention. Thornton Park  MD, MD 12/31/2022 11:46:42 AM This report has been signed electronically.

## 2023-01-01 ENCOUNTER — Telehealth: Payer: Self-pay

## 2023-01-01 NOTE — Telephone Encounter (Signed)
No answer, left message to call if having any issues or concerns, B.Jax Kentner RN 

## 2023-01-07 ENCOUNTER — Encounter: Payer: Self-pay | Admitting: Gastroenterology

## 2023-01-24 ENCOUNTER — Encounter: Payer: Self-pay | Admitting: Nurse Practitioner

## 2023-01-28 ENCOUNTER — Encounter: Payer: Self-pay | Admitting: Nurse Practitioner

## 2023-01-28 ENCOUNTER — Ambulatory Visit (INDEPENDENT_AMBULATORY_CARE_PROVIDER_SITE_OTHER): Payer: Medicare Other | Admitting: Nurse Practitioner

## 2023-01-28 VITALS — BP 120/68 | HR 62 | Temp 97.6°F | Ht 71.0 in | Wt 218.6 lb

## 2023-01-28 DIAGNOSIS — R0989 Other specified symptoms and signs involving the circulatory and respiratory systems: Secondary | ICD-10-CM | POA: Diagnosis not present

## 2023-01-28 DIAGNOSIS — E782 Mixed hyperlipidemia: Secondary | ICD-10-CM

## 2023-01-28 DIAGNOSIS — Z79899 Other long term (current) drug therapy: Secondary | ICD-10-CM

## 2023-01-28 NOTE — Patient Instructions (Signed)

## 2023-01-28 NOTE — Progress Notes (Signed)
Assessment and Plan:  Jeremy Fowler was seen today for an episodic visit.  Diagnoses and all order for this visit:  Labile hypertension Discussed DASH (Dietary Approaches to Stop Hypertension) DASH diet is lower in sodium than a typical American diet. Cut back on foods that are high in saturated fat, cholesterol, and trans fats. Eat more whole-grain foods, fish, poultry, and nuts Remain active and exercise as tolerated daily.  Monitor BP at home-Call if greater than 130/80.   Medication management All medications discussed and reviewed in full. All questions and concerns regarding medications addressed.    Notify office for further evaluation and treatment, questions or concerns if s/s fail to improve. The risks and benefits of my recommendations, as well as other treatment options were discussed with the patient today. Questions were answered.  Further disposition pending results of labs. Discussed med's effects and SE's.    Over 15 minutes of exam, counseling, chart review, and critical decision making was performed.   Future Appointments  Date Time Provider Department Center  06/18/2023  9:30 AM Lucky Cowboy, MD [facility] None  12/09/2023  9:00 AM Raynelle Dick, NP [facility] None  03/05/2024 10:00 AM Raynelle Dick, NP [facility] None    ------------------------------------------------------------------------------------------------------------------   HPI BP 120/68   Pulse 62   Temp 97.6 F (36.4 C)   Ht 5\' 11"  (1.803 m)   Wt 218 lb 9.6 oz (99.2 kg)   SpO2 98%   BMI 30.49 kg/m    67 y.o.male presents for evaluation of most recent elevation in BP readings.  States that he went to give blood approximately 2 weeks ago and he was unable to continue the process d/t BP reading >150/80.  Since the, he has been monitoring his BP in the home and has noticed elevations systolically in the 140s and 150s.  He is not on any anti-hypertensive's.  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  He has noticed occasional flushing at night.  He does not check his BP during this time.  He continues to remain active, hiking, walking.  He denies any other symptoms including angina, CP, SOB, dizziness, vision changes, diaphoresis, HA.    Review of last BP's are as follows: BP Readings from Last 3 Encounters:  01/28/23 120/68  12/31/22 137/77  12/04/22 138/72   He does have a hx of hyperlipidemia. He is on cholesterol medication and denies myalgias. His cholesterol is not at goal with elevated triglycerides. The cholesterol last visit was:   Lab Results  Component Value Date   CHOL 184 12/04/2022   HDL 51 12/04/2022   LDLCALC 101 (H) 12/04/2022   TRIG 197 (H) 12/04/2022   CHOLHDL 3.6 12/04/2022    Past Medical History:  Diagnosis Date   Anal fissure 03/14/2016   Colon polyp 04-28-2002   Colonoscopy    HSV-2 infection    Hyperlipemia    Hypertension    Insomnia    Interstitial cystitis 03/05/2016   Prolapsed internal hemorrhoids, grade 2 08/04/2012   Grade 2 hemorrhoids  05/11/15 band ligation right posterior hemorrhoidal bundle 06/14/15 band ligation right anterior hemorrhoidal bundle 07/13/15 LL banded    Prostatitis      Allergies  Allergen Reactions   Sulfa Antibiotics     dizzy disoriented   Elemental Sulfur Other (See Comments)    disoriented   Sulfur Other (See Comments)    disoriented, disoriented  Current Outpatient Medications on File Prior to Visit  Medication Sig   acyclovir (ZOVIRAX) 400 MG tablet TAKE 1 TABLET BY MOUTH TWICE DAILY PROPHYLAXIS  FOR  MOUTH  ULCERS   Cholecalciferol (VITAMIN D) 50 MCG (2000 UT) tablet Take 2,000 Units by mouth daily.   Multiple Vitamin (MULTIVITAMIN) tablet Take 1 tablet by mouth daily.   Omega-3 Fatty Acids (FISH OIL PO) Take 1 capsule by mouth daily.   PSYLLIUM PO Take by mouth.   rosuvastatin (CRESTOR) 5 MG tablet Take 1  tablet (5 mg total) by mouth daily.   sildenafil (VIAGRA) 50 MG tablet Take 1 tablet (50 mg total) by mouth as needed for erectile dysfunction.   Eszopiclone 3 MG TABS Take 1 tablet at bedtime  ONLY if needed for Sleep  &  limit to 5 days /week to avoid Addiction & Dementia (Patient not taking: Reported on 01/28/2023)   Meloxicam 15 MG TBDP Take 1 tablet by mouth daily. (Patient not taking: Reported on 01/28/2023)   Current Facility-Administered Medications on File Prior to Visit  Medication   0.9 %  sodium chloride infusion    ROS: all negative except what is noted in the HPI.   Physical Exam:  BP 120/68   Pulse 62   Temp 97.6 F (36.4 C)   Ht 5\' 11"  (1.803 m)   Wt 218 lb 9.6 oz (99.2 kg)   SpO2 98%   BMI 30.49 kg/m   General Appearance: NAD.  Awake, conversant and cooperative. Eyes: PERRLA, EOMs intact.  Sclera white.  Conjunctiva without erythema. Sinuses: No frontal/maxillary tenderness.  No nasal discharge. Nares patent.  ENT/Mouth: Ext aud canals clear.  Bilateral TMs w/DOL and without erythema or bulging. Hearing intact.  Posterior pharynx without swelling or exudate.  Tonsils without swelling or erythema.  Neck: Supple.  No masses, nodules or thyromegaly. Respiratory: Effort is regular with non-labored breathing. Breath sounds are equal bilaterally without rales, rhonchi, wheezing or stridor.  Cardio: RRR with no MRGs. Brisk peripheral pulses without edema.  Abdomen: Active BS in all four quadrants.  Soft and non-tender without guarding, rebound tenderness, hernias or masses. Lymphatics: Non tender without lymphadenopathy.  Musculoskeletal: Full ROM, 5/5 strength, normal ambulation.  No clubbing or cyanosis. Skin: Appropriate color for ethnicity. Warm without rashes, lesions, ecchymosis, ulcers.  Neuro: CN II-XII grossly normal. Normal muscle tone without cerebellar symptoms and intact sensation.   Psych: AO X 3,  appropriate mood and affect, insight and judgment.      Adela Glimpse, NP 11:52 AM [facility]

## 2023-02-02 ENCOUNTER — Other Ambulatory Visit: Payer: Self-pay | Admitting: Nurse Practitioner

## 2023-03-05 DIAGNOSIS — K080 Exfoliation of teeth due to systemic causes: Secondary | ICD-10-CM | POA: Diagnosis not present

## 2023-04-28 ENCOUNTER — Encounter: Payer: Self-pay | Admitting: Orthopedic Surgery

## 2023-04-28 ENCOUNTER — Ambulatory Visit: Payer: Medicare Other | Admitting: Orthopedic Surgery

## 2023-04-28 ENCOUNTER — Other Ambulatory Visit (INDEPENDENT_AMBULATORY_CARE_PROVIDER_SITE_OTHER): Payer: Medicare Other

## 2023-04-28 DIAGNOSIS — M545 Low back pain, unspecified: Secondary | ICD-10-CM

## 2023-04-28 MED ORDER — METHOCARBAMOL 500 MG PO TABS: 500.0000 mg | ORAL_TABLET | Freq: Three times a day (TID) | ORAL | 0 refills | Status: DC | PRN

## 2023-04-28 MED ORDER — PREDNISONE 5 MG (21) PO TBPK: ORAL_TABLET | ORAL | 0 refills | Status: DC

## 2023-04-28 NOTE — Progress Notes (Signed)
Office Visit Note   Patient: Jeremy Fowler           Date of Birth: [birth year]           MRN: [id number] Visit Date: 04/28/2023 Requested by: Lucky Cowboy, MD [address] [place],  [place] [address] PCP: Lucky Cowboy, MD  Subjective: Chief Complaint  Patient presents with   Lower Back - Pain    HPI: Jeremy Fowler is a 67 y.o. male who presents to the office reporting low back pain with significantly painful episodes over the past 2 months.  Last episode started a week ago while he was putting socks on.  Had pretty debilitating pain for 2 days.  He was walking humped over at that time.  He is doing better today.  Patient is retired and is very active working out 4 days a week doing elliptical along with other exercises.  He was fairly incapacitated on the floor for 2 minutes with this last episode but was able to manage to get back into an upright position.  Uses ice and ibuprofen for symptoms.  He is okay when he coughs and sneezes.  Denies any leg weakness.  Denies much in the way of leg symptoms or numbness and tingling..                ROS: All systems reviewed are negative as they relate to the chief complaint within the history of present illness.  Patient denies fevers or chills.  Assessment & Plan: Visit Diagnoses:  1. Low back pain, unspecified back pain laterality, unspecified chronicity, unspecified whether sciatica present     Plan: Impression is facet arthritis exacerbation in a patient with degenerative disc disease.  Compared radiographs to those from about 10 years ago.  Overall he has progressed but not to significantly in terms of joint space narrowing between the vertebral bodies over the past 10 years.  L4-5 is worse but does not appear to be in the operative category.  Plan at this time is Medrol Dosepak and Robaxin prescribed for future flareup.  If he has continued symptoms despite that medication then MRI scan indicated with possible ESI's to  follow.  Follow-Up Instructions: No follow-ups on file.   Orders:  Orders Placed This Encounter  Procedures   XR Lumbar Spine 2-3 Views   Meds ordered this encounter  Medications   predniSONE (STERAPRED UNI-PAK 21 TAB) 5 MG (21) TBPK tablet    Sig: Take dosepak as directed    Dispense:  21 tablet    Refill:  0   methocarbamol (ROBAXIN) 500 MG tablet    Sig: Take 1 tablet (500 mg total) by mouth every 8 (eight) hours as needed for muscle spasms.    Dispense:  30 tablet    Refill:  0      Procedures: No procedures performed   Clinical Data: No additional findings.  Objective: Vital Signs: There were no vitals taken for this visit.  Physical Exam:  Constitutional: Patient appears well-developed HEENT:  Head: Normocephalic Eyes:EOM are normal Neck: Normal range of motion Cardiovascular: Normal rate Pulmonary/chest: Effort normal Neurologic: Patient is alert Skin: Skin is warm Psychiatric: Patient has normal mood and affect  Ortho Exam: Ortho exam demonstrates normal gait and alignment.  No nerve root tension signs.  5 out of 5 ankle dorsiflexion plantarflexion quad hamstring strength along with hip flexion strength in abduction and adduction.  Pedal pulses palpable.  Reflexes symmetric at 0 to 1+ out  of 4 bilateral patella and Achilles.  No paresthesias L1-S1 bilaterally.  Mild pain with forward lateral bending but no trochanteric tenderness is noted and no skin changes in the back region.  Specialty Comments:  No specialty comments available.  Imaging: XR Lumbar Spine 2-3 Views  Result Date: 04/28/2023 AP lateral radiographs lumbar spine reviewed.  Moderate to severe degenerative disc disease is present throughout the lumbar spine most significant at L4-5.  No spondylolisthesis or compression fractures.  Facet arthritis is most severe at L4-5 and L5-S1.    PMFS History: Patient Active Problem List   Diagnosis Date Noted   Perianal dermatitis 09/16/2019   Left  arm pain 02/09/2019   Eustachian tube dysfunction, bilateral 07/10/2017   Tinnitus, bilateral 07/10/2017   Interstitial cystitis 03/05/2016   History of basal cell carcinoma 03/17/2015   Vitamin D deficiency 03/17/2015   Medication management 03/17/2015   Tobacco use disorder 03/17/2015   Hyperlipemia    Hypertension    Insomnia    HSV-2 infection    Prolapsed internal hemorrhoids, grade 2 08/04/2012   BPH (benign prostatic hyperplasia) 07/29/2012   Hx of colonic polyps 07/29/2012   Family hx of colon cancer to grandparents 07/29/2012   Past Medical History:  Diagnosis Date   Anal fissure 03/14/2016   Colon polyp 04-28-2002   Colonoscopy    HSV-2 infection    Hyperlipemia    Hypertension    Insomnia    Interstitial cystitis 03/05/2016   Prolapsed internal hemorrhoids, grade 2 08/04/2012   Grade 2 hemorrhoids  05/11/15 band ligation right posterior hemorrhoidal bundle 06/14/15 band ligation right anterior hemorrhoidal bundle 07/13/15 LL banded    Prostatitis     Family History  Problem Relation Age of Onset   Cancer Mother    Colon cancer Maternal Grandmother        dx in her late 30's   Colon polyps Neg Hx    Esophageal cancer Neg Hx    Rectal cancer Neg Hx    Stomach cancer Neg Hx     Past Surgical History:  Procedure Laterality Date   CALCANEAL OSTEOTOMY W/ INTERNAL FIXATION     COLONOSCOPY  08/04/2012   Procedure: COLONOSCOPY;  Surgeon: Louis Meckel, MD;  Location: [facility];  Service: Endoscopy;  Laterality: N/A;  with possible banding   HEMORRHOID BANDING     HEMORRHOID BANDING  09/30/2014   TRICEPS TENDON REPAIR  01/2019   Social History   Occupational History   Occupation: Occupational hygienist    Comment: Retired  Tobacco Use   Smoking status: Light Smoker    Types: Cigars   Smokeless tobacco: Never   Tobacco comments:    Tobacco info given 04/17/15  Substance and Sexual Activity   Alcohol use: Yes    Alcohol/week: 2.0 standard drinks of alcohol    Types: 2  Glasses of wine per week    Comment: ocassional   Drug use: No   Sexual activity: Not on file

## 2023-05-05 ENCOUNTER — Ambulatory Visit: Payer: Medicare Other | Admitting: Orthopedic Surgery

## 2023-06-08 ENCOUNTER — Other Ambulatory Visit: Payer: Self-pay | Admitting: Internal Medicine

## 2023-06-17 NOTE — Progress Notes (Incomplete)
Future Appointments  Date Time Provider Department  06/18/2023  9:30 AM Lucky Cowboy, MD [facility]  12/09/2023  9:00 AM Raynelle Dick, NP [facility]  03/05/2024 10:00 AM Raynelle Dick, NP [facility]      History of Present Illness:       This very nice 67 y.o. MWM presents for 6 month follow up with HTN, HLD, Pre-Diabetes and Vitamin D Deficiency.        Patient is monitored expectantly for labile  HTN  since 2003  & BP has been controlled at home. Today's BP is at goal -  130/72. Patient has had no complaints of any cardiac type chest pain, palpitations, dyspnea / orthopnea / PND, dizziness, claudication, or dependent edema.       Hyperlipidemia is near controlled with diet & Rosuvastatin.   Patient denies myalgias or other med SE's. Last Lipids were near goal :  Lab Results  Component Value Date   CHOL 189 11/13/2021   HDL 60 11/13/2021   LDLCALC 104 (H) 11/13/2021   TRIG 151 (H) 11/13/2021   CHOLHDL 3.2 11/13/2021     Also, the patient  is monitored expectantly for glucose intolerance and has had no symptoms of reactive hypoglycemia, diabetic polys, paresthesias or visual blurring.  Last A1c was Normal & at goal :  Lab Results  Component Value Date   HGBA1C 5.2 11/13/2021                                                            Further, the patient also has history of Vitamin D Deficiency  ("44" /2020) and supplements vitamin D. Last vitamin D was at goal :  Lab Results  Component Value Date   VD25OH 63 11/13/2021     Current Outpatient Medications on File Prior to Visit  Medication Sig  . acyclovir (ZOVIRAX) 400 MG tablet Take  1 tablet  2 x /day  prophylaxis for mouth ulcers  . alfuzosin (UROXATRAL) 10 MG 24 hr tablet Take 1 tablet Daily for Prostate  . Cholecalciferol (VITAMIN D) 50 MCG (2000 UT) tablet Take 2,000 Units by mouth daily.  . clotrimazole-betamethasone (LOTRISONE) cream APPLY 1 CREAM TOPICALLY TWICE DAILY  . LOMOTIL) 2.5-0.025  MG tablet Take 1 tablet 4 times daily as needed for diarrhea   . Eszopiclone 3 MG TABS Take 1 tablet at bedtime  ONLY if needed for Sleep   . hydrocortisone (ANUCORT-HC) 25 MG suppository INSERT 1 SUPPOSITORY RECTALLY 3 TO 4 TIMES DAILY  . Multiple Vitamin (MULTIVITAMIN) tablet Take 1 tablet  daily.  . Omega-3 Fatty Acids (FISH OIL PO) Take 1 capsule  daily.  . PSYLLIUM PO Take by mouth.  . rosuvastatin (CRESTOR) 20 MG tablet TAKE 1 TABLET  DAILY FOR CHOLESTEROL  . sildenafil (VIAGRA) 50 MG tablet Take 1 tablet as needed for erectile dysfunction.     Allergies  Allergen Reactions  . Sulfa Antibiotics     dizzy disoriented  . Elemental Sulfur Other (See Comments)    disoriented     PMHx:   Past Medical History:  Diagnosis Date  . Anal fissure 03/14/2016  . Colon polyp 04-28-2002   Colonoscopy   . HSV-2 infection   . Hyperlipemia   . Hypertension   . Insomnia   .  Interstitial cystitis 03/05/2016  . Prolapsed internal hemorrhoids, grade 2 08/04/2012   Grade 2 hemorrhoids  05/11/15 band ligation right posterior hemorrhoidal bundle 06/14/15 band ligation right anterior hemorrhoidal bundle 07/13/15 LL banded   . Prostatitis      Immunization History  Administered Date(s) Administered  . Influenza 06/30/2018  . Moderna Sars-Covid-2 Vacc 12/16/2019, 01/14/2020, 08/08/2020  . PPD Test 03/31/2018, 05/04/2019, 06/08/2020  . Td 01/22/2007  . Tdap 10/01/2017  . Zoster Recombinat (Shingrix) 10/01/2017, 11/01/2017     Past Surgical History:  Procedure Laterality Date  . CALCANEAL OSTEOTOMY W/ INTERNAL FIXATION    . COLONOSCOPY  08/04/2012   Procedure: COLONOSCOPY;  Surgeon: Louis Meckel, MD;  Location: [facility];  Service: Endoscopy;  Laterality: N/A;  with possible banding  . HEMORRHOID BANDING    . HEMORRHOID BANDING  2016    FHx:    Reviewed / unchanged  SHx:    Reviewed / unchanged   Systems Review:  Constitutional: Denies fever, chills, wt changes, headaches,  insomnia, fatigue, night sweats, change in appetite. Eyes: Denies redness, blurred vision, diplopia, discharge, itchy, watery eyes.  ENT: Denies discharge, congestion, post nasal drip, epistaxis, sore throat, earache, hearing loss, dental pain, tinnitus, vertigo, sinus pain, snoring.  CV: Denies chest pain, palpitations, irregular heartbeat, syncope, dyspnea, diaphoresis, orthopnea, PND, claudication or edema. Respiratory: denies cough, dyspnea, DOE, pleurisy, hoarseness, laryngitis, wheezing.  Gastrointestinal: Denies dysphagia, odynophagia, heartburn, reflux, water brash, abdominal pain or cramps, nausea, vomiting, bloating, diarrhea, constipation, hematemesis, melena, hematochezia  or hemorrhoids. Genitourinary: Denies dysuria, frequency, urgency, nocturia, hesitancy, discharge, hematuria or flank pain. Musculoskeletal: Denies arthralgias, myalgias, stiffness, jt. swelling, pain, limping or strain/sprain.  Skin: Denies pruritus, rash, hives, warts, acne, eczema or change in skin lesion(s). Neuro: No weakness, tremor, incoordination, spasms, paresthesia or pain. Psychiatric: Denies confusion, memory loss or sensory loss. Endo: Denies change in weight, skin or hair change.  Heme/Lymph: No excessive bleeding, bruising or enlarged lymph nodes.  Physical Exam  There were no vitals taken for this visit.  Appears  well nourished, well groomed  and in no distress.  Eyes: PERRLA, EOMs, conjunctiva no swelling or erythema. Sinuses: No frontal/maxillary tenderness ENT/Mouth: EAC's clear, TM's nl w/o erythema, bulging. Nares clear w/o erythema, swelling, exudates. Oropharynx clear without erythema or exudates. Oral hygiene is good. Tongue normal, non obstructing. Hearing intact.  Neck: Supple. Thyroid not palpable. Car 2+/2+ without bruits, nodes or JVD. Chest: Respirations nl with BS clear & equal w/o rales, rhonchi, wheezing or stridor.  Cor: Heart sounds normal w/ regular rate and rhythm without  sig. murmurs, gallops, clicks or rubs. Peripheral pulses normal and equal  without edema.  Abdomen: Soft & bowel sounds normal. Non-tender w/o guarding, rebound, hernias, masses or organomegaly.  Lymphatics: Unremarkable.  Musculoskeletal: Full ROM all peripheral extremities, joint stability, 5/5 strength and normal gait.  Skin: Warm, dry without exposed rashes, lesions or ecchymosis apparent.  Neuro: Cranial nerves intact, reflexes equal bilaterally. Sensory-motor testing grossly intact. Tendon reflexes grossly intact.  Pysch: Alert & oriented x 3.  Insight and judgement nl & appropriate. No ideations.  Assessment and Plan:  1. Labile hypertension  - Continue medication, monitor blood pressure at home.  - Continue DASH diet.  Reminder to go to the ER if any CP,  SOB, nausea, dizziness, severe HA, changes vision/speech.   - CBC with Differential/Platelet - COMPLETE METABOLIC PANEL WITH GFR - Magnesium - TSH  2. Hyperlipidemia, mixed  - Continue diet/meds, exercise,& lifestyle modifications.  - Continue  monitor periodic cholesterol/liver & renal functions    - Lipid panel - TSH  3. Abnormal glucose  - Continue diet, exercise  - Lifestyle modifications.  - Monitor appropriate labs   - Hemoglobin A1c - Insulin, random  4. Vitamin D deficiency  - Continue supplementation   - VITAMIN D 25 Hydroxy   5. Medication management  - CBC with Differential/Platelet - COMPLETE METABOLIC PANEL WITH GFR - Magnesium - Lipid panel - TSH - Hemoglobin A1c - Insulin, random - VITAMIN D 25 Hydroxy          Discussed  regular exercise, BP monitoring, weight control to achieve/maintain BMI less than 25 and discussed med and SE's. Recommended labs to assess /monitor clinical status .  I discussed the assessment and treatment plan with the patient. The patient was provided an opportunity to ask questions and all were answered. The patient agreed with the plan and demonstrated an  understanding of the instructions.  I provided over 30 minutes of exam, counseling, chart review and  complex critical decision making.        The patient was advised to call back or seek an in-person evaluation if the symptoms worsen or if the condition fails to improve as anticipated.   Marinus Maw, MD

## 2023-06-17 NOTE — Progress Notes (Signed)
Future Appointments  Date Time Provider Department  06/18/2023  9:30 AM Lucky Cowboy, MD [facility]  12/09/2023  9:00 AM Raynelle Dick, NP [facility]  03/05/2024 10:00 AM Raynelle Dick, NP [facility]        This very nice 67 y.o. MWM presents for 6 month follow up with HTN, HLD, Pre-Diabetes and Vitamin D Deficiency.        Patient is monitored expectantly for labile  HTN  since 2003  & BP has been controlled at home. Today's BP is at goal - 130/70 . Patient has had no complaints of any cardiac type chest pain, palpitations, dyspnea Pollyann Kennedy /PND, dizziness, claudication or dependent edema.       Hyperlipidemia is near controlled with diet & Rosuvastatin.   Patient denies myalgias or other med SE's. Last Lipids were near goal :  Lab Results  Component Value Date   CHOL 184 12/04/2022   HDL 51 12/04/2022   LDLCALC 101 (H) 12/04/2022   TRIG 197 (H) 12/04/2022   CHOLHDL 3.6 12/04/2022     Also, the patient  is monitored expectantly for glucose intolerance and has had no symptoms of reactive hypoglycemia, diabetic polys, paresthesias or visual blurring.  Last A1c was Normal & at goal :  Lab Results  Component Value Date   HGBA1C 5.5 12/04/2022                                                         Further, the patient also has history of Vitamin D Deficiency  ("44" /2020) and supplements vitamin D. Last vitamin D was at goal :  Lab Results  Component Value Date   VD25OH 55 12/04/2022       Current Outpatient Medications  Medication Instructions   acyclovir (400 MG tablet TAKE 1 TABLET  TWICE DAILY PROPHYLAXIS  FOR  MOUTH  ULCERS.   Eszopiclone 3 MG TABS TAKE 1 TABLET AT BEDTIME    Meloxicam 15 MG TBDP 1 tablet Daily   methocarbamol (ROBAXIN) 500 mg Every 8 hours PRN   Multiple Vitamin   1 tablet Daily   Omega-3  FISH OIL 1 capsule Daily   PSYLLIUM  Oral   rosuvastatin (CRESTOR) 5 mg Daily   sildenafil (VIAGRA) 50 mg  As needed   Vitamin D 2,000 Units  Daily     Allergies  Allergen Reactions   Sulfa Antibiotics     dizzy disoriented   Elemental Sulfur Other (See Comments)    disoriented     PMHx:   Past Medical History:  Diagnosis Date   Anal fissure 03/14/2016   Colon polyp 04-28-2002   Colonoscopy    HSV-2 infection    Hyperlipemia    Hypertension    Insomnia    Interstitial cystitis 03/05/2016   Prolapsed internal hemorrhoids, grade 2 08/04/2012   Grade 2 hemorrhoids  05/11/15 band ligation right posterior hemorrhoidal bundle 06/14/15 band ligation right anterior hemorrhoidal bundle 07/13/15 LL banded    Prostatitis      Immunization History  Administered Date(s) Administered   Influenza 06/30/2018   Moderna Sars-Covid-2 Vacc 12/16/2019, 01/14/2020, 08/08/2020   PPD Test 03/31/2018, 05/04/2019, 06/08/2020   Td 01/22/2007   Tdap 10/01/2017   Zoster Recombinat (Shingrix) 10/01/2017, 11/01/2017    12/31/2022 Colonoscopy - Dr Carmelia Roller  3 year f/u due  April 2027    Past Surgical History:  Procedure Laterality Date   CALCANEAL OSTEOTOMY W/ INTERNAL FIXATION     COLONOSCOPY  08/04/2012   Procedure: COLONOSCOPY;  Surgeon: Louis Meckel, MD;  Location: [facility];  Service: Endoscopy;  Laterality: N/A;  with possible banding   HEMORRHOID BANDING     HEMORRHOID BANDING  2016    FHx:    Reviewed / unchanged  SHx:    Reviewed / unchanged   Systems Review:  Constitutional: Denies fever, chills, wt changes, headaches, insomnia, fatigue, night sweats, change in appetite. Eyes: Denies redness, blurred vision, diplopia, discharge, itchy, watery eyes.  ENT: Denies discharge, congestion, post nasal drip, epistaxis, sore throat, earache, hearing loss, dental pain, tinnitus, vertigo, sinus pain, snoring.  CV: Denies chest pain, palpitations, irregular heartbeat, syncope, dyspnea, diaphoresis, orthopnea, PND, claudication or edema. Respiratory: denies cough, dyspnea, DOE, pleurisy, hoarseness, laryngitis, wheezing.   Gastrointestinal: Denies dysphagia, odynophagia, heartburn, reflux, water brash, abdominal pain or cramps, nausea, vomiting, bloating, diarrhea, constipation, hematemesis, melena, hematochezia  or hemorrhoids. Genitourinary: Denies dysuria, frequency, urgency, nocturia, hesitancy, discharge, hematuria or flank pain. Musculoskeletal: Denies arthralgias, myalgias, stiffness, jt. swelling, pain, limping or strain/sprain.  Skin: Denies pruritus, rash, hives, warts, acne, eczema or change in skin lesion(s). Neuro: No weakness, tremor, incoordination, spasms, paresthesia or pain. Psychiatric: Denies confusion, memory loss or sensory loss. Endo: Denies change in weight, skin or hair change.  Heme/Lymph: No excessive bleeding, bruising or enlarged lymph nodes.  Physical Exam  BP 130/70   Pulse 62   Temp 97.9 F (36.6 C)   Resp 17   Ht 5\' 11"  (1.803 m)   Wt 217 lb 3.2 oz (98.5 kg)   SpO2 98%   BMI 30.29 kg/m   Appears  well nourished, well groomed  and in no distress.  Eyes: PERRLA, EOMs, conjunctiva no swelling or erythema. Sinuses: No frontal/maxillary tenderness ENT/Mouth: EAC's clear, TM's nl w/o erythema, bulging. Nares clear w/o erythema, swelling, exudates. Oropharynx clear without erythema or exudates. Oral hygiene is good. Tongue normal, non obstructing. Hearing intact.  Neck: Supple. Thyroid not palpable. Car 2+/2+ without bruits, nodes or JVD. Chest: Respirations nl with BS clear & equal w/o rales, rhonchi, wheezing or stridor.  Cor: Heart sounds normal w/ regular rate and rhythm without sig. murmurs, gallops, clicks or rubs. Peripheral pulses normal and equal  without edema.  Abdomen: Soft & bowel sounds normal. Non-tender w/o guarding, rebound, hernias, masses or organomegaly.  Lymphatics: Unremarkable.  Musculoskeletal: Full ROM all peripheral extremities, joint stability, 5/5 strength and normal gait.  Skin: Warm, dry without exposed rashes, lesions or ecchymosis apparent.   Neuro: Cranial nerves intact, reflexes equal bilaterally. Sensory-motor testing grossly intact. Tendon reflexes grossly intact.  Pysch: Alert & oriented x 3.  Insight and judgement nl & appropriate. No ideations.  Assessment and Plan:  1. Labile hypertension  - Continue medication, monitor blood pressure at home.  - Continue DASH diet.  Reminder to go to the ER if any CP,  SOB, nausea, dizziness, severe HA, changes vision/speech.   - CBC with Differential/Platelet - COMPLETE METABOLIC PANEL WITH GFR - Magnesium - TSH  2. Hyperlipidemia, mixed  - Continue diet/meds, exercise,& lifestyle modifications.  - Continue monitor periodic cholesterol/liver & renal functions    - Lipid panel - TSH  3. Abnormal glucose  - Continue diet, exercise  - Lifestyle modifications.  - Monitor appropriate labs   - Hemoglobin A1c - Insulin, random  4.  Vitamin D deficiency  - Continue supplementation   - VITAMIN D 25 Hydroxy   5. Medication management  - CBC with Differential/Platelet - COMPLETE METABOLIC PANEL WITH GFR - Magnesium - Lipid panel - TSH - Hemoglobin A1c - Insulin, random - VITAMIN D 25 Hydroxy          Discussed  regular exercise, BP monitoring, weight control to achieve/maintain BMI less than 25 and discussed med and SE's. Recommended labs to assess /monitor clinical status .  I discussed the assessment and treatment plan with the patient. The patient was provided an opportunity to ask questions and all were answered. The patient agreed with the plan and demonstrated an understanding of the instructions.  I provided over 30 minutes of exam, counseling, chart review and  complex critical decision making.        The patient was advised to call back or seek an in-person evaluation if the symptoms worsen or if the condition fails to improve as anticipated.   Marinus Maw, MD

## 2023-06-18 ENCOUNTER — Encounter: Payer: Self-pay | Admitting: Internal Medicine

## 2023-06-18 ENCOUNTER — Ambulatory Visit (INDEPENDENT_AMBULATORY_CARE_PROVIDER_SITE_OTHER): Payer: Medicare Other | Admitting: Internal Medicine

## 2023-06-18 VITALS — BP 130/70 | HR 62 | Temp 97.9°F | Resp 17 | Ht 71.0 in | Wt 217.2 lb

## 2023-06-18 DIAGNOSIS — R0989 Other specified symptoms and signs involving the circulatory and respiratory systems: Secondary | ICD-10-CM

## 2023-06-18 DIAGNOSIS — E782 Mixed hyperlipidemia: Secondary | ICD-10-CM | POA: Diagnosis not present

## 2023-06-18 DIAGNOSIS — R7309 Other abnormal glucose: Secondary | ICD-10-CM | POA: Diagnosis not present

## 2023-06-18 DIAGNOSIS — Z23 Encounter for immunization: Secondary | ICD-10-CM | POA: Diagnosis not present

## 2023-06-18 DIAGNOSIS — E559 Vitamin D deficiency, unspecified: Secondary | ICD-10-CM

## 2023-06-18 DIAGNOSIS — Z79899 Other long term (current) drug therapy: Secondary | ICD-10-CM

## 2023-06-18 NOTE — Patient Instructions (Signed)

## 2023-07-02 ENCOUNTER — Encounter: Payer: Self-pay | Admitting: Internal Medicine

## 2023-07-02 ENCOUNTER — Other Ambulatory Visit: Payer: Self-pay | Admitting: Internal Medicine

## 2023-07-02 MED ORDER — DEXAMETHASONE 4 MG PO TABS: ORAL_TABLET | ORAL | 0 refills | Status: DC

## 2023-07-03 ENCOUNTER — Ambulatory Visit
Admission: RE | Admit: 2023-07-03 | Discharge: 2023-07-03 | Disposition: A | Discharge status: Home / Self Care | Payer: Medicare Other | Source: Ambulatory Visit | Attending: Orthopedic Surgery | Admitting: Orthopedic Surgery

## 2023-07-03 ENCOUNTER — Telehealth: Payer: Self-pay

## 2023-07-03 ENCOUNTER — Other Ambulatory Visit: Payer: Self-pay

## 2023-07-03 DIAGNOSIS — M545 Low back pain, unspecified: Secondary | ICD-10-CM

## 2023-07-03 DIAGNOSIS — M5126 Other intervertebral disc displacement, lumbar region: Secondary | ICD-10-CM | POA: Diagnosis not present

## 2023-07-03 NOTE — Telephone Encounter (Signed)
See other message

## 2023-07-03 NOTE — Telephone Encounter (Signed)
FYI-patient called back again, I have ordered and MRI for him per Dean;s last note Patient aware

## 2023-07-03 NOTE — Telephone Encounter (Signed)
Patient called and left voicemail that he was told to call back if his back was no better, and he states back is getting worse and difficult to walk without pain.

## 2023-07-04 ENCOUNTER — Other Ambulatory Visit (HOSPITAL_BASED_OUTPATIENT_CLINIC_OR_DEPARTMENT_OTHER): Payer: Self-pay

## 2023-07-04 DIAGNOSIS — M545 Low back pain, unspecified: Secondary | ICD-10-CM

## 2023-07-04 NOTE — Progress Notes (Signed)
Referral made and appt cancelled

## 2023-07-04 NOTE — Progress Notes (Signed)
I called - mri discussed - pls rf to fn for lsp esi thx please cancel follow-up appointment with me.  Thanks

## 2023-07-07 ENCOUNTER — Telehealth: Payer: Self-pay | Admitting: Physical Medicine and Rehabilitation

## 2023-07-07 NOTE — Telephone Encounter (Signed)
Spoke with patient and informed him that the injection has to go through the authorization process before scheduling. Verbalized understanding

## 2023-07-07 NOTE — Telephone Encounter (Signed)
Patient called and wanted to make an appointment for MRI review and injection. CB#[phone]

## 2023-07-14 ENCOUNTER — Ambulatory Visit: Payer: Medicare Other | Admitting: Physical Medicine and Rehabilitation

## 2023-07-14 ENCOUNTER — Other Ambulatory Visit: Payer: Self-pay

## 2023-07-14 VITALS — BP 148/94 | HR 62

## 2023-07-14 DIAGNOSIS — M545 Low back pain, unspecified: Secondary | ICD-10-CM | POA: Diagnosis not present

## 2023-07-14 DIAGNOSIS — M5116 Intervertebral disc disorders with radiculopathy, lumbar region: Secondary | ICD-10-CM

## 2023-07-14 DIAGNOSIS — M47816 Spondylosis without myelopathy or radiculopathy, lumbar region: Secondary | ICD-10-CM | POA: Diagnosis not present

## 2023-07-14 DIAGNOSIS — M5416 Radiculopathy, lumbar region: Secondary | ICD-10-CM

## 2023-07-14 DIAGNOSIS — M7918 Myalgia, other site: Secondary | ICD-10-CM

## 2023-07-14 DIAGNOSIS — G8929 Other chronic pain: Secondary | ICD-10-CM

## 2023-07-14 MED ORDER — METHYLPREDNISOLONE ACETATE 40 MG/ML IJ SUSP
40.0000 mg | Freq: Once | INTRAMUSCULAR | Status: AC
Start: 2023-07-14 — End: 2023-07-14
  Administered 2023-07-14: 40 mg

## 2023-07-14 NOTE — Progress Notes (Signed)
Functional Pain Scale - descriptive words and definitions  Mild (2)   Noticeable when not distracted/no impact on ADL's/sleep only slightly affected and able to   use both passive and active distraction for comfort. Mild range order, actually 1- 1&1/2  Average Pain 1   +Driver, -BT, -Dye Allergies.   L5-S1 IL

## 2023-07-14 NOTE — Patient Instructions (Signed)
[facility] Discharge Instructions  *At any time if you have questions or concerns they can be answered by calling [phone]  All Patients: You may experience an increase in your symptoms for the first 2 days (it can take 2 days to 2 weeks for the steroid/cortisone to have its maximal effect). You may use ice to the site for the first 24 hours; 20 minutes on and 20 minutes off and may use heat after that time. You may resume and continue your current pain medications. If you need a refill please contact the prescribing physician. You may resume your medications if any were stopped for the procedure. You may shower but no swimming, tub bath or Jacuzzi for 24 hours. Please remove bandage after 4 hours. You may resume light activities as tolerated. If you had Spine Injection, you should not drive for the next 3 hours due to anesthetics used in the procedure. Please have someone drive for you.  *If you have had sedation, Valium, Xanax, or lorazepam: Do not drive or use public transportation for 24 hours, do not operating hazardous machinery or make important personal/business decisions for 24 hours.  POSSIBLE STEROID SIDE EFFECTS: If experienced these should only last for a short period. Change in menstrual flow  Edema in (swelling)  Increased appetite Skin flushing (redness)  Skin rash/acne  Thrush (oral) Vaginitis    Increased sweating  Depression Increased blood glucose levels Cramping and leg/calf  Euphoria (feeling happy)  POSSIBLE PROCEDURE SIDE EFFECTS: Please call our office if concerned. Increased pain Increased numbness/tingling  Headache Nausea/vomiting Hematoma (bruising/bleeding) Edema (swelling at the site) Weakness  Infection (red/drainage at site) Fever greater than 100.66F  *In the event of a headache after epidural steroid injection: Drink plenty of fluids, especially water and try to lay flat when possible. If the headache does not get better after a few days  or as always if concerned please call the office. Dr. Lu Duffel McGill  "The Big Three" that will safely increase your endurance and protect your back: modified curl-up, side bridge, and bird dog.  1. Modified Curl-Up Lie your back with one knee bent and one knee straight, this puts your pelvis in a neutral position and the muscles of your core in an optimal alignment of pull to avoid strain on the low back. Place your hands under the arch of your low back and ensure that this arch is maintained throughout the curl-up. Start by bracing your abdomen; this is different from flexing your abs, bear down through your belly. Now make sure you can take a breath in and a breath out while maintaining this brace. If you cannot, stop there and practice doing just that until you've got it mastered! Now, pretend that your spine in your neck and your upper back are cemented together and do not move independently. Pick a spot on the ceiling and focus your gaze there, lift your shoulder blades about 30 off the floor and slowly return to the start position. Take note of your neck, and ensure that your chin isn't poking forward when you do a curl up. If you're struggling with that, focus on making a double chin. Perform 3 sets of 10-12.  2. Side Bridge Lie on your side and prop yourself up on your elbow. Ensure that your elbow is directly under your shoulder to avoid any unnecessary strain through your shoulder joint. With your legs straight, place your top foot on the ground in front of your bottom foot. Place your  top hand on your bottom shoulder. While maintaining the natural curve of your spine, that is to say, be sure that your upper body isn't twisted or leaning forward, brace your abdomen, squeeze through your gluteals (clench your bum), and lift your hips up off the ground. Don't forget to breathe! Hold for 8-10 seconds, repeat 3 times. As the exercise becomes easier, increase the number of repetitions as opposed to the  length of time. There are a number of ways to modify this exercise in order to increase or decrease the difficulty such as the example below on the right. If it's not challenging enough, try putting that top hand on your top hip, or straight up in the air, but again, be sure your body stays straight!  3. Bird Dog Start on your hands and knees with your hands shoulder width apart directly under your shoulders, and knees hip width apart directly under your hips. Maintain a neutral spine. Brace through your abdomen and squeeze your gluteals. Ensure you can maintain this while you take a breath in and out. Lift your right arm in front until it's level with your shoulder, squeezing the muscles between your shoulder blades as you do so. At the same time, extend your left leg straight back until it is level with your hips, squeezing your gluteals, and keeping your hips square to the floor. Return to the starting position in a slow and controlled manner, and perform the same action with the left arm and right leg. That is one repetition. Perform 3 sets of 8-10 repetitions. As this exercise becomes easy, focus on co-contracting the muscles of your forearm and arms while you extend, the same goes for the muscles of your legs. For an additional challenge, instead of putting your hand and knee back down on the ground between reps, try just sweeping the floor and performing the next rep right away, or draw a square with your arm and leg and then sweep the floor.  *Avoid exercises that forward flex the spine or extend the spine.

## 2023-07-16 ENCOUNTER — Ambulatory Visit: Payer: Medicare Other | Admitting: Orthopedic Surgery

## 2023-07-29 ENCOUNTER — Encounter: Payer: Self-pay | Admitting: Physical Medicine and Rehabilitation

## 2023-07-29 NOTE — Procedures (Signed)
Lumbar Epidural Steroid Injection - Interlaminar Approach with Fluoroscopic Guidance  Patient: Jeremy Fowler      Date of Birth: [birth year] MRN: [id number] PCP: Lucky Cowboy, MD      Visit Date: 07/14/2023   Universal Protocol:     Consent Given By: the patient  Position: PRONE  Additional Comments: Vital signs were monitored before and after the procedure. Patient was prepped and draped in the usual sterile fashion. The correct patient, procedure, and site was verified.   Injection Procedure Details:   Procedure diagnoses: Lumbar radiculopathy [M54.16]   Meds Administered:  Meds ordered this encounter  Medications   methylPREDNISolone acetate (DEPO-MEDROL) injection 40 mg     Laterality: Right  Location/Site:  L5-S1  Needle: 3.5 in., 20 ga. Tuohy  Needle Placement: Paramedian epidural  Findings:   -Comments: Excellent flow of contrast into the epidural space.  Procedure Details: Using a paramedian approach from the side mentioned above, the region overlying the inferior lamina was localized under fluoroscopic visualization and the soft tissues overlying this structure were infiltrated with 4 ml. of 1% Lidocaine without Epinephrine. The Tuohy needle was inserted into the epidural space using a paramedian approach.   The epidural space was localized using loss of resistance along with counter oblique bi-planar fluoroscopic views.  After negative aspirate for air, blood, and CSF, a 2 ml. volume of Isovue-250 was injected into the epidural space and the flow of contrast was observed. Radiographs were obtained for documentation purposes.    The injectate was administered into the level noted above.   Additional Comments:  The patient tolerated the procedure well Dressing: 2 x 2 sterile gauze and Band-Aid    Post-procedure details: Patient was observed during the procedure. Post-procedure instructions were reviewed.  Patient left the clinic in stable condition.

## 2023-07-29 NOTE — Progress Notes (Signed)
SEGUNDO NEVEL - 67 y.o. male MRN [id number]  Date of birth: 1956/02/26  Office Visit Note: Visit Date: 07/14/2023 PCP: Lucky Cowboy, MD Referred by: Lucky Cowboy, MD  Subjective: Chief Complaint  Patient presents with   Lower Back - Pain   HPI:  CONAN BAPTIST is a 67 y.o. male who comes in today at the request of Dr. Burnard Bunting for evaluation and management and MRI review.  I did end up completing a Right L5-S1 Lumbar Interlaminar epidural steroid injection with fluoroscopic guidance.  The patient has failed conservative care including home exercise, medications, time and activity modification.  This injection will be diagnostic and hopefully therapeutic.  Please see requesting physician notes for further details and justification.  I did go over his MRI with him today using spine images and models.  We also discussed at length core strengthening as well as Dr. Lu Duffel McGill review of exercises.  We talked about episodic tweaking of the spine versus what he is having with the small disc herniation.  We talked about issues with surgical approaches to this.  The patient is very active and continues to stay active and wants to do that and I think that is reasonable.  We spent approximately 25 minutes speaking with the patient face-to-face looking at treatment plans and evaluation and management.  This does not appear to be anything related to his hips or neuropathy etc.  He does have some myofascial pain on exam.   Review of Systems  Musculoskeletal:  Positive for back pain and joint pain.  Neurological:  Positive for weakness.  All other systems reviewed and are negative.  Otherwise per HPI.  Assessment & Plan: Visit Diagnoses:    ICD-10-CM   1. Lumbar radiculopathy  M54.16 XR C-ARM NO REPORT    Epidural Steroid injection    methylPREDNISolone acetate (DEPO-MEDROL) injection 40 mg    2. Radiculopathy due to lumbar intervertebral disc disorder  M51.16     3. Chronic bilateral  low back pain without sciatica  M54.50    G89.29     4. Spondylosis without myelopathy or radiculopathy, lumbar region  M47.816     5. Myofascial pain syndrome  M79.18       Plan: No additional findings.   Meds & Orders:  Meds ordered this encounter  Medications   methylPREDNISolone acetate (DEPO-MEDROL) injection 40 mg    Orders Placed This Encounter  Procedures   XR C-ARM NO REPORT   Epidural Steroid injection    Follow-up: Return if symptoms worsen or fail to improve.   Procedures: No procedures performed  Lumbar Epidural Steroid Injection - Interlaminar Approach with Fluoroscopic Guidance  Patient: LUAN LEJA      Date of Birth: Aug 03, 1956 MRN: [id number] PCP: Lucky Cowboy, MD      Visit Date: 07/14/2023   Universal Protocol:     Consent Given By: the patient  Position: PRONE  Additional Comments: Vital signs were monitored before and after the procedure. Patient was prepped and draped in the usual sterile fashion. The correct patient, procedure, and site was verified.   Injection Procedure Details:   Procedure diagnoses: Lumbar radiculopathy [M54.16]   Meds Administered:  Meds ordered this encounter  Medications   methylPREDNISolone acetate (DEPO-MEDROL) injection 40 mg     Laterality: Right  Location/Site:  L5-S1  Needle: 3.5 in., 20 ga. Tuohy  Needle Placement: Paramedian epidural  Findings:   -Comments: Excellent flow of contrast into the epidural space.  Procedure  Details: Using a paramedian approach from the side mentioned above, the region overlying the inferior lamina was localized under fluoroscopic visualization and the soft tissues overlying this structure were infiltrated with 4 ml. of 1% Lidocaine without Epinephrine. The Tuohy needle was inserted into the epidural space using a paramedian approach.   The epidural space was localized using loss of resistance along with counter oblique bi-planar fluoroscopic views.  After  negative aspirate for air, blood, and CSF, a 2 ml. volume of Isovue-250 was injected into the epidural space and the flow of contrast was observed. Radiographs were obtained for documentation purposes.    The injectate was administered into the level noted above.   Additional Comments:  The patient tolerated the procedure well Dressing: 2 x 2 sterile gauze and Band-Aid    Post-procedure details: Patient was observed during the procedure. Post-procedure instructions were reviewed.  Patient left the clinic in stable condition.   Clinical History: CLINICAL DATA:  Spinal stenosis, lumbar increased pain; pain with walking   EXAM: MRI LUMBAR SPINE WITHOUT CONTRAST   TECHNIQUE: Multiplanar, multisequence MR imaging of the lumbar spine was performed. No intravenous contrast was administered.   COMPARISON:  None Available.   FINDINGS: Segmentation:  Standard.   Alignment:  Physiologic.   Vertebrae: No fracture, evidence of discitis, or bone lesion. Multilevel degenerative endplate changes. There is a Schmorl's node at T12.   Conus medullaris and cauda equina: Conus extends to the L1-L2 disc space level. Conus and cauda equina appear normal.   Paraspinal and other soft tissues: Negative.   Disc levels:   T12-L1: Unremarkable   L1-L2: Unremarkable   L2-L3: Minimal disc bulge. Mild bilateral facet degenerative change. Mild spinal canal narrowing. Neural foraminal narrowing.   L3-L4: Moderate left and mild right facet degenerative change. No significant disc bulge. No spinal canal narrowing. Mild bilateral neural foraminal narrowing.   L4-L5: Mild bilateral facet degenerative change. Minimal disc bulge. No spinal canal narrowing. Mild bilateral neural foraminal narrowing.   L5-S1: Right paracentral disc protrusion with narrowing of the right lateral recess. No spinal canal narrowing. Mild bilateral facet degenerative change. Mild bilateral neural foraminal  narrowing.   IMPRESSION: 1. Right paracentral disc protrusion at L5-S1 with mild narrowing of the right lateral recess. 2. Mild spinal canal narrowing at L2-L3. 3. Mild bilateral neural foraminal narrowing from L3-L4 through L5-S1.     Electronically Signed   By: Lorenza Cambridge M.D.   On: 07/03/2023 16:02     Objective:  VS:  HT:    WT:   BMI:     BP:(!) 148/94  HR:62bpm  TEMP: ( )  RESP:  Physical Exam Vitals and nursing note reviewed.  Constitutional:      General: He is not in acute distress.    Appearance: Normal appearance. He is well-developed. He is not ill-appearing.  HENT:     Head: Normocephalic and atraumatic.     Right Ear: External ear normal.     Left Ear: External ear normal.     Nose: No congestion.  Eyes:     Extraocular Movements: Extraocular movements intact.     Conjunctiva/sclera: Conjunctivae normal.     Pupils: Pupils are equal, round, and reactive to light.  Cardiovascular:     Rate and Rhythm: Normal rate.     Pulses: Normal pulses.     Heart sounds: Normal heart sounds.  Pulmonary:     Effort: Pulmonary effort is normal. No respiratory distress.  Abdominal:  General: There is no distension.     Palpations: Abdomen is soft.  Musculoskeletal:        General: Tenderness present. No signs of injury.     Cervical back: Normal range of motion and neck supple. No rigidity.     Right lower leg: No edema.     Left lower leg: No edema.     Comments: Patient has good distal strength without clonus.  Pain with facet joint loading.  Trigger points palpated but not reproducing most of his pain.  No pain with hip rotation.  Skin:    General: Skin is warm and dry.     Findings: No erythema or rash.  Neurological:     General: No focal deficit present.     Mental Status: He is alert and oriented to person, place, and time.     Cranial Nerves: No cranial nerve deficit.     Sensory: No sensory deficit.     Motor: No weakness or abnormal muscle  tone.     Coordination: Coordination normal.     Gait: Gait normal.  Psychiatric:        Mood and Affect: Mood normal.        Behavior: Behavior normal.      Imaging: No results found.

## 2023-08-18 ENCOUNTER — Other Ambulatory Visit: Payer: Self-pay | Admitting: Nurse Practitioner

## 2023-09-18 ENCOUNTER — Ambulatory Visit: Payer: Medicare Other | Admitting: Nurse Practitioner

## 2023-10-24 ENCOUNTER — Other Ambulatory Visit: Payer: Self-pay | Admitting: Nurse Practitioner

## 2023-10-24 ENCOUNTER — Encounter: Payer: Self-pay | Admitting: Internal Medicine

## 2023-10-24 DIAGNOSIS — E782 Mixed hyperlipidemia: Secondary | ICD-10-CM

## 2023-11-20 ENCOUNTER — Other Ambulatory Visit: Payer: Self-pay

## 2023-11-20 DIAGNOSIS — E782 Mixed hyperlipidemia: Secondary | ICD-10-CM

## 2023-11-20 MED ORDER — ROSUVASTATIN CALCIUM 5 MG PO TABS
5.0000 mg | ORAL_TABLET | Freq: Every day | ORAL | 2 refills | Status: DC
Start: 2023-11-20 — End: 2024-07-08

## 2023-12-09 ENCOUNTER — Encounter: Payer: Medicare Other | Admitting: Nurse Practitioner

## 2023-12-17 ENCOUNTER — Encounter: Payer: Self-pay | Admitting: Family

## 2023-12-17 ENCOUNTER — Ambulatory Visit (INDEPENDENT_AMBULATORY_CARE_PROVIDER_SITE_OTHER): Payer: Medicare Other | Admitting: Family

## 2023-12-17 VITALS — BP 148/74 | HR 64 | Temp 97.7°F | Ht 71.0 in | Wt 219.4 lb

## 2023-12-17 DIAGNOSIS — R350 Frequency of micturition: Secondary | ICD-10-CM

## 2023-12-17 DIAGNOSIS — G4700 Insomnia, unspecified: Secondary | ICD-10-CM

## 2023-12-17 DIAGNOSIS — Z85828 Personal history of other malignant neoplasm of skin: Secondary | ICD-10-CM

## 2023-12-17 DIAGNOSIS — E782 Mixed hyperlipidemia: Secondary | ICD-10-CM | POA: Diagnosis not present

## 2023-12-17 DIAGNOSIS — N401 Enlarged prostate with lower urinary tract symptoms: Secondary | ICD-10-CM

## 2023-12-17 DIAGNOSIS — L989 Disorder of the skin and subcutaneous tissue, unspecified: Secondary | ICD-10-CM | POA: Diagnosis not present

## 2023-12-17 NOTE — Assessment & Plan Note (Signed)
 Taking Crestor 5mg  qd, last lipids 06/2023 wnl, denies any SE. No refills needed today. -F/U 6 mos with fasting labs

## 2023-12-17 NOTE — Assessment & Plan Note (Signed)
 Chronic enlarged prostate with long-standing issues. No current urologist after previous providers' passing. - Send referral to [facility] for evaluation and management.

## 2023-12-17 NOTE — Assessment & Plan Note (Signed)
 Taking Lunesta nightly with good results, denies any SE. No refills needed today. -Continue Lunesta qhs. -F/U 6 mos

## 2023-12-17 NOTE — Progress Notes (Signed)
 New Patient Office Visit  Subjective:  Patient ID: Jeremy Fowler, male    DOB: [birth year]  Age: 68 y.o. MRN: [id number]  CC:  Chief Complaint  Patient presents with   New Patient (Initial Visit)   Hair/Scalp Problem    Pt c/o dry scalp problem on right side, present off and on for months.     HPI RUVIM RISKO presents for establishing care today.  History of Present Illness   The patient presents with a hx of hyperlipidemia that is controlled with diet and low dose Crestor qd, insomnia and is controlled with Lunesta qhs.  He also c/o a persistent scalp lesion that has been present for 'eight or nine months.' The lesion, described as a 'scab,' is located on the scalp and is frequently re-injured during hair brushing, leading to occasional bleeding. The patient denies any known injury to the area and has not attempted to pick at or remove the lesion. He has a history of a similar lesion on the nose that required surgical intervention. The patient also has a history of decades long problems with BPH and is currently without a urologist.     Assessment & Plan:     Scalp lesion - Chronic scalp lesion for 8-9 months that occasionally bleeds, appears as a thick black scab on right side of crown of head, approx 1.5cm in diameter.  Differential includes persistent scab or dermatological condition. Biopsy and removal may prevent recurrent injury. - Pt reports seeing Dermatology assoc. within the last 2 years, advised to call and schedule an appt to have lesion assessed and possible biopsy.  BPH - Chronic enlarged prostate with long-standing issues. No current urologist after previous providers' passing. - Send referral to [facility] for evaluation and management.  Insomnia - Taking Lunesta nightly with good results, denies any SE. No refills needed today. -Continue Lunesta qhs prn. -F/U 6 mos  Hyperlipidemia - Taking Crestor 5mg  qd, last lipids 06/2023 wnl, denies any SE. No refills  needed today. -F/U 6 mos with fasting labs  General Health Maintenance Discussed importance of annual flu and COVID-19 vaccinations.  Last CPE 06/2023. - Encourage annual flu vaccination. - Encourage annual COVID-19 vaccination. -Schedule CPE w/labs for September.      Subjective:    Outpatient Medications Prior to Visit  Medication Sig Dispense Refill   acyclovir (ZOVIRAX) 400 MG tablet TAKE 1 TABLET BY MOUTH TWICE DAILY PROPHYLAXIS  FOR  MOUTH  ULCERS. 180 tablet 0   Cholecalciferol (VITAMIN D) 50 MCG (2000 UT) tablet Take 2,000 Units by mouth daily.     Eszopiclone 3 MG TABS TAKE 1 TABLET BY MOUTH AT BEDTIME ONLY  IF  NEEDED  FOR  SLEEP  AND  LIMIT  TO  5  DAYS  PER  WEEK  TO  AVOID  ADDICTION  AND  DEMENTIA 30 tablet 0   Multiple Vitamin (MULTIVITAMIN) tablet Take 1 tablet by mouth daily.     Omega-3 Fatty Acids (FISH OIL PO) Take 1 capsule by mouth daily.     PSYLLIUM PO Take by mouth.     rosuvastatin (CRESTOR) 5 MG tablet Take 1 tablet (5 mg total) by mouth daily. 30 tablet 2   sildenafil (VIAGRA) 50 MG tablet Take 1 tablet (50 mg total) by mouth as needed for erectile dysfunction. 20 tablet 1   dexamethasone (DECADRON) 4 MG tablet Take 1 tab 3 x day - 3 days, then 2 x day - 3 days, then 1 tab  daily 20 tablet 0   Meloxicam 15 MG TBDP Take 1 tablet by mouth daily. 30 tablet 0   methocarbamol (ROBAXIN) 500 MG tablet Take 1 tablet (500 mg total) by mouth every 8 (eight) hours as needed for muscle spasms. 30 tablet 0   No facility-administered medications prior to visit.   Past Medical History:  Diagnosis Date   Allergy    Anal fissure 03/14/2016   Colon polyp 04/28/2002   Colonoscopy    HSV-2 infection    Hyperlipemia    Hypertension    Insomnia    Interstitial cystitis 03/05/2016   Prolapsed internal hemorrhoids, grade 2 08/04/2012   Grade 2 hemorrhoids  05/11/15 band ligation right posterior hemorrhoidal bundle 06/14/15 band ligation right anterior hemorrhoidal bundle  07/13/15 LL banded    Prostatitis    Sleep apnea    Past Surgical History:  Procedure Laterality Date   CALCANEAL OSTEOTOMY W/ INTERNAL FIXATION     COLONOSCOPY  08/04/2012   Procedure: COLONOSCOPY;  Surgeon: Louis Meckel, MD;  Location: [facility];  Service: Endoscopy;  Laterality: N/A;  with possible banding   FRACTURE SURGERY     HEMORRHOID BANDING     HEMORRHOID BANDING  09/30/2014   TRICEPS TENDON REPAIR  01/2019    Objective:   Today's Vitals: BP (!) 148/74 (BP Location: Left Arm, Patient Position: Sitting, Cuff Size: Large)   Pulse 64   Temp 97.7 F (36.5 C) (Temporal)   Ht 5\' 11"  (1.803 m)   Wt 219 lb 6.4 oz (99.5 kg)   BMI 30.60 kg/m   Physical Exam Vitals and nursing note reviewed.  Constitutional:      General: He is not in acute distress.    Appearance: Normal appearance.  HENT:     Head: Normocephalic.   Cardiovascular:     Rate and Rhythm: Normal rate and regular rhythm.  Pulmonary:     Effort: Pulmonary effort is normal.     Breath sounds: Normal breath sounds.  Musculoskeletal:        General: Normal range of motion.     Cervical back: Normal range of motion.  Skin:    General: Skin is warm and dry.     Findings: Lesion (right side of crown of head, thick brownish-black scab, approx 1.5cm in diameter) present.  Neurological:     Mental Status: He is alert and oriented to person, place, and time.  Psychiatric:        Mood and Affect: Mood normal.    Dulce Sellar, NP

## 2023-12-17 NOTE — Patient Instructions (Addendum)
 Welcome to [facility], It was a pleasure meeting you today!   Let us know when you need refills. Please call your Dermatology office to have the lesion on your scalp evaluated. Let me know if a new referral is needed. I have sent a referral over to [facility].  Please schedule a physical with fasting labs in September or sooner if your insurance allows.      PLEASE NOTE: If you had any LAB tests please let us know if you have not heard back within a few days. You may see your results on MyChart before we have a chance to review them but we will give you a call once they are reviewed by [place]. If we ordered any REFERRALS today, please let us know if you have not heard from their office within the next week.  Let us know through MyChart if you are needing REFILLS, or have your pharmacy send [place] the request. You can also use MyChart to communicate with me or any office staff.  Please try these tips to maintain a healthy lifestyle: It is important that you exercise regularly at least 30 minutes 5 times a week. Think about what you will eat, plan ahead. Choose whole foods, & think  "clean, green, fresh or frozen" over canned, processed or packaged foods which are more sugary, salty, and fatty. 70 to 75% of food eaten should be fresh vegetables and protein. 2-3  meals daily with healthy snacks between meals, but must be whole fruit, protein or vegetables. Aim to eat over a 10 hour period when you are active, for example, 7am to 5pm, and then STOP after your last meal of the day, drinking only water.  Shorter eating windows, 6-8 hours, are showing benefits in heart disease and blood sugar regulation. Drink water every day! Shoot for 64 ounces daily = 8 cups, no other drink is as healthy! Fruit juice is best enjoyed in a healthy way, by EATING the fruit.

## 2023-12-23 ENCOUNTER — Ambulatory Visit: Payer: Medicare Other | Admitting: Internal Medicine

## 2024-01-06 ENCOUNTER — Ambulatory Visit (INDEPENDENT_AMBULATORY_CARE_PROVIDER_SITE_OTHER): Admitting: Family

## 2024-01-06 ENCOUNTER — Encounter: Payer: Self-pay | Admitting: Family

## 2024-01-06 VITALS — BP 148/81 | HR 60 | Temp 97.4°F | Ht 71.0 in | Wt 218.5 lb

## 2024-01-06 DIAGNOSIS — R030 Elevated blood-pressure reading, without diagnosis of hypertension: Secondary | ICD-10-CM | POA: Diagnosis not present

## 2024-01-06 DIAGNOSIS — E782 Mixed hyperlipidemia: Secondary | ICD-10-CM | POA: Diagnosis not present

## 2024-01-06 DIAGNOSIS — N401 Enlarged prostate with lower urinary tract symptoms: Secondary | ICD-10-CM | POA: Diagnosis not present

## 2024-01-06 DIAGNOSIS — Z0000 Encounter for general adult medical examination without abnormal findings: Secondary | ICD-10-CM | POA: Diagnosis not present

## 2024-01-06 DIAGNOSIS — R350 Frequency of micturition: Secondary | ICD-10-CM | POA: Diagnosis not present

## 2024-01-06 LAB — LIPID PANEL
Cholesterol: 179 mg/dL (ref 0–200)
HDL: 50.7 mg/dL (ref >39.00)
LDL Cholesterol: 105 mg/dL — ABNORMAL HIGH (ref 0–99)
NonHDL: 128.54
Total CHOL/HDL Ratio: 4
Triglycerides: 116 mg/dL (ref 0.0–149.0)
VLDL: 23.2 mg/dL (ref 0.0–40.0)

## 2024-01-06 LAB — CBC WITH DIFFERENTIAL/PLATELET
Basophils Absolute: 0 10*3/uL (ref 0.0–0.1)
Basophils Relative: 1 % (ref 0.0–3.0)
Eosinophils Absolute: 0.1 10*3/uL (ref 0.0–0.7)
Eosinophils Relative: 2 % (ref 0.0–5.0)
HCT: 41.5 % (ref 39.0–52.0)
Hemoglobin: 13.9 g/dL (ref 13.0–17.0)
Lymphocytes Relative: 34 % (ref 12.0–46.0)
Lymphs Abs: 1.5 10*3/uL (ref 0.7–4.0)
MCHC: 33.6 g/dL (ref 30.0–36.0)
MCV: 95.9 fl (ref 78.0–100.0)
Monocytes Absolute: 0.4 10*3/uL (ref 0.1–1.0)
Monocytes Relative: 9.3 % (ref 3.0–12.0)
Neutro Abs: 2.4 10*3/uL (ref 1.4–7.7)
Neutrophils Relative %: 53.7 % (ref 43.0–77.0)
Platelets: 201 10*3/uL (ref 150.0–400.0)
RBC: 4.33 Mil/uL (ref 4.22–5.81)
RDW: 14.4 % (ref 11.5–15.5)
WBC: 4.5 10*3/uL (ref 4.0–10.5)

## 2024-01-06 LAB — COMPREHENSIVE METABOLIC PANEL WITH GFR
ALT: 32 U/L (ref 0–53)
AST: 24 U/L (ref 0–37)
Albumin: 4.7 g/dL (ref 3.5–5.2)
Alkaline Phosphatase: 69 U/L (ref 39–117)
BUN: 15 mg/dL (ref 6–23)
CO2: 28 meq/L (ref 19–32)
Calcium: 9.6 mg/dL (ref 8.4–10.5)
Chloride: 103 meq/L (ref 96–112)
Creatinine, Ser: 1.04 mg/dL (ref 0.40–1.50)
GFR: 74.17 mL/min (ref >60.00)
Glucose, Bld: 104 mg/dL — ABNORMAL HIGH (ref 70–99)
Potassium: 4.7 meq/L (ref 3.5–5.1)
Sodium: 140 meq/L (ref 135–145)
Total Bilirubin: 0.8 mg/dL (ref 0.2–1.2)
Total Protein: 6.9 g/dL (ref 6.0–8.3)

## 2024-01-06 LAB — PSA: PSA: 1.73 ng/mL (ref 0.10–4.00)

## 2024-01-06 LAB — TSH: TSH: 1.99 u[IU]/mL (ref 0.35–5.50)

## 2024-01-06 NOTE — Progress Notes (Signed)
 Phone: [phone]  Subjective:  Patient 68 y.o. male presenting for annual physical.  Chief Complaint  Patient presents with   Annual Exam    Fasting w/ labs   Discussed the use of AI scribe software for clinical note transcription with the patient, who gave verbal consent to proceed.  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 He reports no specific health complaints or changes since his last visit. He mentions a family history of colon cancer on his mother's side, but no personal history of cancer. He has been proactive about his health, having regular physical examinations and EKGs in the past due to his former profession. He also had an ultrasound for aortic aneurysm screening in the past, which was normal. He has not been a regular cigarette smoker, but was exposed to secondhand smoke during his childhood. He consumes alcohol daily, usually a beer while making dinner and a glass of wine during dinner.  See problem oriented charting- ROS- full  review of systems was completed and negative except for: what is noted in HPI above.  The following were reviewed and entered/updated in epic: Past Medical History:  Diagnosis Date   Allergy    Anal fissure 03/14/2016   Colon polyp 04/28/2002   Colonoscopy    Eustachian tube dysfunction, bilateral 07/10/2017   HSV-2 infection    Hyperlipemia    Hypertension    Insomnia    Interstitial cystitis 03/05/2016   Left arm pain 02/09/2019   Medication management 03/17/2015   Prolapsed internal hemorrhoids, grade 2 08/04/2012   Grade 2 hemorrhoids  05/11/15 band ligation right posterior hemorrhoidal bundle 06/14/15 band ligation right anterior hemorrhoidal bundle 07/13/15 LL banded    Prostatitis    Sleep apnea    Patient Active Problem List   Diagnosis Date Noted   Interstitial cystitis 03/05/2016   History of basal cell  carcinoma 03/17/2015   Vitamin D deficiency 03/17/2015   Hyperlipemia    Insomnia    HSV-2 infection    BPH (benign prostatic hyperplasia) 07/29/2012   Hx of colonic polyps 07/29/2012   Family hx of colon cancer to grandparents 07/29/2012   Past Surgical History:  Procedure Laterality Date   CALCANEAL OSTEOTOMY W/ INTERNAL FIXATION     COLONOSCOPY  08/04/2012   Procedure: COLONOSCOPY;  Surgeon: Louis Meckel, MD;  Location: [facility];  Service: Endoscopy;  Laterality: N/A;  with possible banding   FRACTURE SURGERY     HEMORRHOID BANDING     HEMORRHOID BANDING  09/30/2014   TRICEPS TENDON REPAIR  01/2019    Family History  Problem Relation Age of Onset   Cancer Mother    Heart disease Father    Colon cancer Maternal Grandmother        dx in her late 29's   Colon polyps Neg Hx    Esophageal cancer Neg Hx    Rectal cancer Neg Hx    Stomach cancer Neg Hx     Medications- reviewed and updated Current Outpatient Medications  Medication Sig Dispense Refill   acyclovir (ZOVIRAX) 400 MG tablet TAKE 1 TABLET BY MOUTH TWICE DAILY PROPHYLAXIS  FOR  MOUTH  ULCERS. 180 tablet 0   Cholecalciferol (VITAMIN D) 50 MCG (2000 UT) tablet Take 2,000 Units by mouth daily.     COMIRNATY syringe Inject 0.3 mLs into the muscle once.     Eszopiclone 3 MG TABS TAKE 1 TABLET  BY MOUTH AT BEDTIME ONLY  IF  NEEDED  FOR  SLEEP  AND  LIMIT  TO  5  DAYS  PER  WEEK  TO  AVOID  ADDICTION  AND  DEMENTIA 30 tablet 0   Multiple Vitamin (MULTIVITAMIN) tablet Take 1 tablet by mouth daily.     Omega-3 Fatty Acids (FISH OIL PO) Take 1 capsule by mouth daily.     PSYLLIUM PO Take by mouth.     rosuvastatin (CRESTOR) 5 MG tablet Take 1 tablet (5 mg total) by mouth daily. 30 tablet 2   sildenafil (VIAGRA) 50 MG tablet Take 1 tablet (50 mg total) by mouth as needed for erectile dysfunction. 20 tablet 1   No current facility-administered medications for this visit.    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  Constitutional:      General: He is not in acute distress.    Appearance: Normal appearance.  HENT:     Head: Normocephalic.     Right Ear: Tympanic membrane and external ear normal.     Left Ear: Tympanic membrane and external ear normal.     Nose: Nose normal.     Mouth/Throat:     Mouth: Mucous membranes are moist.  Eyes:     Extraocular Movements: Extraocular movements intact.  Cardiovascular:     Rate and Rhythm: Normal rate and regular rhythm.  Pulmonary:     Effort: Pulmonary effort is normal.     Breath sounds: Normal breath sounds.  Abdominal:     General: Abdomen is flat. There is no distension.     Palpations: Abdomen is soft.     Tenderness: There is no abdominal tenderness.  Musculoskeletal:        General: Normal range of motion.     Cervical back: Normal range of motion.  Skin:    General: Skin is warm and dry.  Neurological:     Mental Status: He is alert and oriented to person, place, and time.  Psychiatric:        Mood and Affect: Mood normal.        Behavior: Behavior normal.        Judgment: Judgment normal.     Assessment and Plan   Health Maintenance counseling: 1. Anticipatory guidance: Patient counseled regarding regular dental exams q6 months, eye exams yearly, avoiding smoking and second hand smoke, limiting  alcohol to 2 beverages per day.   2. Risk factor reduction:  Advised patient of need for regular exercise and diet rich in fruits and vegetables to reduce risk of heart attack and stroke. Exercise- walking.   Wt Readings from Last 3 Encounters:  01/06/24 218 lb 8 oz (99.1 kg)  12/17/23 219  lb 6.4 oz (99.5 kg)  06/18/23 217 lb 3.2 oz (98.5 kg)   3. Immunizations/screenings/ancillary studies Immunization History  Administered Date(s) Administered   Influenza Inj Mdck Quad Pf 07/04/2017   Influenza, High Dose Seasonal PF 06/18/2023   Influenza,inj,Quad PF,6+ Mos 07/16/2018, 08/08/2020   Influenza-Unspecified 06/30/2018, 08/05/2022   Moderna Sars-Covid-2 Vaccination 12/16/2019, 01/14/2020, 08/08/2020   PFIZER(Purple Top)SARS-COV-2 Vaccination 08/05/2022   PNEUMOCOCCAL CONJUGATE-20 09/03/2022   PPD Test 03/31/2018, 05/04/2019, 06/08/2020   Td 01/22/2007   Td (Adult),5 Lf Tetanus Toxid, Preservative Free 07/04/2017   Tdap 10/01/2017   Zoster Recombinant(Shingrix) 07/04/2017, 09/08/2017, 10/01/2017, 11/01/2017   There are no preventive care reminders to display for this patient.  4. Prostate cancer screening >68yo - risk factors?  Lab Results  Component Value Date   PSA 1.54 12/04/2022   PSA 1.35 11/13/2021   PSA 1.40 11/13/2020    5. Colon cancer screening: 12/2022 - 1 polyp 6. Skin cancer screening-  advised regular sunscreen use. Denies worrisome, changing, or new skin lesions. Established with Dermatology. 7. Smoking associated screening (lung cancer screening, AAA screen 65-75, UA)- occasional cigar smoker - had negative screening abdominal U/S done last year 8. STD screening - N/A 9. Alcohol screening: 2 drinks per day, more if going out to eat  Assessment & Plan BPH - Awaiting PSA results, last annual checks all wnl. No symptoms or family history of prostate cancer. Discussed PSA monitoring, prostate cancer nature. Has upcoming appt with Urology. - Review PSA results once  available. - Schedule follow-up with urologist if necessary.  Cardiovascular Health/Elevated BP in office - Regular EKGs due to previous occupation as a Occupational hygienist. Last year EKG normal. Right bundle branch block managed. Discussed EKG necessity only if symptomatic. Denies any HTN sx.  - Perform EKG if he develops symptoms such as irregular rhythm or increased fatigue. - Advised on low sodium diet, increase exercise as able, drink at least 2L water qd. - Monitor cardiovascular health during routine check-ups.  Aortic Aneurysm Screening - Screening due to smoking history. Last year ultrasound normal. Discussed screening next year or sooner if symptomatic. - Consider aortic aneurysm screening every other year if asymptomatic.  General Health Maintenance - Routine physical exam conducted. Discussed lifestyle factors, alcohol consumption, and smoking risks. Colonoscopy last year normal with one polyp. Emphasized regular screenings, exercise, and healthy weight. - Perform full cholesterol panel, PSA, thyroid, blood counts, liver enzymes, and electrolytes. - Provide summary of preventative information. - Schedule lab work for blood tests. - Review lab results and communicate any concerns. - Encourage moderation in alcohol consumption. - Discuss potential risks of smoking and secondhand smoke exposure.  Recommended follow up:  Return for any future concerns, Complete physical w/fasting labs, med refills. No future appointments.  Lab/Order associations:fasting  Dulce Sellar, NP

## 2024-01-06 NOTE — Patient Instructions (Addendum)
 It was very nice to see you today!   I will review your lab results via MyChart in a few days.  Stay well! Get outside and enjoy the Spring weather :-)     PLEASE NOTE:  If you had any lab tests please let us know if you have not heard back within a few days. You may see your results on MyChart before we have a chance to review them but we will give you a call once they are reviewed by [place]. If we ordered any referrals today, please let us know if you have not heard from their office within the next week.

## 2024-01-08 ENCOUNTER — Encounter: Payer: Self-pay | Admitting: Family

## 2024-02-25 ENCOUNTER — Encounter: Payer: Self-pay | Admitting: Family

## 2024-02-25 MED ORDER — ACYCLOVIR 400 MG PO TABS: ORAL_TABLET | ORAL | 0 refills | Status: DC

## 2024-02-25 MED ORDER — ESZOPICLONE 3 MG PO TABS: ORAL_TABLET | ORAL | 0 refills | Status: DC

## 2024-03-04 DIAGNOSIS — D485 Neoplasm of uncertain behavior of skin: Secondary | ICD-10-CM | POA: Diagnosis not present

## 2024-03-04 DIAGNOSIS — C4441 Basal cell carcinoma of skin of scalp and neck: Secondary | ICD-10-CM | POA: Diagnosis not present

## 2024-03-05 ENCOUNTER — Ambulatory Visit: Payer: Medicare Other | Admitting: Nurse Practitioner

## 2024-03-05 DIAGNOSIS — L570 Actinic keratosis: Secondary | ICD-10-CM | POA: Diagnosis not present

## 2024-03-05 DIAGNOSIS — D485 Neoplasm of uncertain behavior of skin: Secondary | ICD-10-CM | POA: Diagnosis not present

## 2024-03-05 DIAGNOSIS — L821 Other seborrheic keratosis: Secondary | ICD-10-CM | POA: Diagnosis not present

## 2024-03-05 DIAGNOSIS — C44712 Basal cell carcinoma of skin of right lower limb, including hip: Secondary | ICD-10-CM | POA: Diagnosis not present

## 2024-04-05 DIAGNOSIS — C4441 Basal cell carcinoma of skin of scalp and neck: Secondary | ICD-10-CM | POA: Diagnosis not present

## 2024-04-12 ENCOUNTER — Encounter: Payer: Medicare Other | Admitting: Nurse Practitioner

## 2024-05-24 ENCOUNTER — Other Ambulatory Visit (INDEPENDENT_AMBULATORY_CARE_PROVIDER_SITE_OTHER): Payer: Self-pay

## 2024-05-24 ENCOUNTER — Ambulatory Visit: Admitting: Surgical

## 2024-05-24 DIAGNOSIS — M25561 Pain in right knee: Secondary | ICD-10-CM | POA: Diagnosis not present

## 2024-05-24 DIAGNOSIS — M25551 Pain in right hip: Secondary | ICD-10-CM | POA: Diagnosis not present

## 2024-06-06 ENCOUNTER — Encounter: Payer: Self-pay | Admitting: Surgical

## 2024-06-06 NOTE — Progress Notes (Signed)
 Office Visit Note   Patient: Jeremy Fowler           Date of Birth: [birth year]           MRN: [id number] Visit Date: 05/24/2024 Requested by: Lucius Krabbe, NP [address] [place],  [place] [address] PCP: Lucius Krabbe, NP  Subjective: Chief Complaint  Patient presents with   Right Knee - Pain   Right Hip - Pain    HPI: Jeremy Fowler is a 68 y.o. male who presents to the office reporting right hip pain.  Patient denies any history of injury.  States that over the last 2 months he has noticed right groin pain that travels to the right knee.  Denies any mechanical symptoms.  No history of prior hip surgery.  Pain was so bad at 1 point that it would wake him up from sleep but currently he is about 80% improved compared with initial onset of symptoms.  He feels like he has calm down quite a bit.  Describes pain with standing and with crossing his legs.  Has been doing a lot of hip mobility exercises.  He has history of low back problems and had prior ESI with Dr. Eldonna on the right at L5-S1 that gave him good relief back in 2024.  No worsening of his typical low back pain.  He is retired.  Enjoys hiking, walking his dog, playing pickle ball, cardio exercise..                ROS: All systems reviewed are negative as they relate to the chief complaint within the history of present illness.  Patient denies fevers or chills.  Assessment & Plan: Visit Diagnoses:  1. Pain in right hip   2. Acute pain of right knee     Plan: Impression is 67 year old male who has right hip pain that is been ongoing for 2 months.  He has radiographs demonstrating mild early degenerative changes in the right hip.  Overall he is about 80% improved with his home exercise program consisting primarily of hip mobility exercises.  We discussed other options available to patient such as trying injection versus formal physical therapy.  He would like to continue with his home exercises and follow-up as needed  if symptoms worsen or fail to resolve and we could consider injection at that point.  Follow-Up Instructions: Return if symptoms worsen or fail to improve.   Orders:  Orders Placed This Encounter  Procedures   XR HIP UNILAT W OR W/O PELVIS 2-3 VIEWS RIGHT   XR KNEE 3 VIEW RIGHT   No orders of the defined types were placed in this encounter.     Procedures: No procedures performed   Clinical Data: No additional findings.  Objective: Vital Signs: There were no vitals taken for this visit.  Physical Exam:  Constitutional: Patient appears well-developed HEENT:  Head: Normocephalic Eyes:EOM are normal Neck: Normal range of motion Cardiovascular: Normal rate Pulmonary/chest: Effort normal Neurologic: Patient is alert Skin: Skin is warm Psychiatric: Patient has normal mood and affect  Ortho Exam: Ortho exam demonstrates right hip with minimal pain reproduced with FADIR sign.  Negative Stinchfield sign.  No Trendelenburg gait.  No tenderness over the right greater trochanter.  Able to perform straight leg raise.  No knee effusion.  No tenderness throughout the medial or lateral joint lines of the right knee.  Has full active and passive motion of the right knee.  Palpable DP pulse.  No  calf tenderness.  Negative Homans' sign.  Intact hip flexion, quadricep, hamstring, dorsiflexion, plantarflexion strength  Specialty Comments:  CLINICAL DATA:  Spinal stenosis, lumbar increased pain; pain with walking   EXAM: MRI LUMBAR SPINE WITHOUT CONTRAST   TECHNIQUE: Multiplanar, multisequence MR imaging of the lumbar spine was performed. No intravenous contrast was administered.   COMPARISON:  None Available.   FINDINGS: Segmentation:  Standard.   Alignment:  Physiologic.   Vertebrae: No fracture, evidence of discitis, or bone lesion. Multilevel degenerative endplate changes. There is a Schmorl's node at T12.   Conus medullaris and cauda equina: Conus extends to the L1-L2  disc space level. Conus and cauda equina appear normal.   Paraspinal and other soft tissues: Negative.   Disc levels:   T12-L1: Unremarkable   L1-L2: Unremarkable   L2-L3: Minimal disc bulge. Mild bilateral facet degenerative change. Mild spinal canal narrowing. Neural foraminal narrowing.   L3-L4: Moderate left and mild right facet degenerative change. No significant disc bulge. No spinal canal narrowing. Mild bilateral neural foraminal narrowing.   L4-L5: Mild bilateral facet degenerative change. Minimal disc bulge. No spinal canal narrowing. Mild bilateral neural foraminal narrowing.   L5-S1: Right paracentral disc protrusion with narrowing of the right lateral recess. No spinal canal narrowing. Mild bilateral facet degenerative change. Mild bilateral neural foraminal narrowing.   IMPRESSION: 1. Right paracentral disc protrusion at L5-S1 with mild narrowing of the right lateral recess. 2. Mild spinal canal narrowing at L2-L3. 3. Mild bilateral neural foraminal narrowing from L3-L4 through L5-S1.     Electronically Signed   By: Lyndall Gore M.D.   On: 07/03/2023 16:02  Imaging: No results found.   PMFS History: Patient Active Problem List   Diagnosis Date Noted   Elevated blood pressure reading without diagnosis of hypertension 01/06/2024   Interstitial cystitis 03/05/2016   History of basal cell carcinoma 03/17/2015   Vitamin D  deficiency 03/17/2015   Hyperlipemia    Insomnia    HSV-2 infection    BPH (benign prostatic hyperplasia) 07/29/2012   Hx of colonic polyps 07/29/2012   Family hx of colon cancer to grandparents 07/29/2012   Past Medical History:  Diagnosis Date   Allergy    Anal fissure 03/14/2016   Colon polyp 04/28/2002   Colonoscopy    Eustachian tube dysfunction, bilateral 07/10/2017   HSV-2 infection    Hyperlipemia    Hypertension    Insomnia    Interstitial cystitis 03/05/2016   Left arm pain 02/09/2019   Medication management  03/17/2015   Prolapsed internal hemorrhoids, grade 2 08/04/2012   Grade 2 hemorrhoids  05/11/15 band ligation right posterior hemorrhoidal bundle 06/14/15 band ligation right anterior hemorrhoidal bundle 07/13/15 LL banded    Prostatitis    Sleep apnea     Family History  Problem Relation Age of Onset   Cancer Mother    Heart disease Father    Colon cancer Maternal Grandmother        dx in her late 41's   Colon polyps Neg Hx    Esophageal cancer Neg Hx    Rectal cancer Neg Hx    Stomach cancer Neg Hx     Past Surgical History:  Procedure Laterality Date   CALCANEAL OSTEOTOMY W/ INTERNAL FIXATION     COLONOSCOPY  08/04/2012   Procedure: COLONOSCOPY;  Surgeon: Lamar JONETTA Aho, MD;  Location: [facility];  Service: Endoscopy;  Laterality: N/A;  with possible banding   FRACTURE SURGERY     HEMORRHOID BANDING  HEMORRHOID BANDING  09/30/2014   TRICEPS TENDON REPAIR  01/2019   Social History   Occupational History   Occupation: Occupational hygienist    Comment: Retired  Tobacco Use   Smoking status: Light Smoker    Types: Cigars   Smokeless tobacco: Never   Tobacco comments:    Tobacco info given 04/17/15  Vaping Use   Vaping status: Not on file  Substance and Sexual Activity   Alcohol use: Yes    Alcohol/week: 10.0 standard drinks of alcohol    Types: 10 Glasses of wine per week    Comment: ocassional   Drug use: No   Sexual activity: Yes    Birth control/protection: None

## 2024-07-06 ENCOUNTER — Other Ambulatory Visit: Payer: Self-pay | Admitting: Family

## 2024-07-06 DIAGNOSIS — E782 Mixed hyperlipidemia: Secondary | ICD-10-CM

## 2024-07-08 ENCOUNTER — Other Ambulatory Visit: Payer: Self-pay

## 2024-07-08 ENCOUNTER — Telehealth: Payer: Self-pay

## 2024-07-08 DIAGNOSIS — E782 Mixed hyperlipidemia: Secondary | ICD-10-CM

## 2024-07-08 MED ORDER — ROSUVASTATIN CALCIUM 5 MG PO TABS
5.0000 mg | ORAL_TABLET | Freq: Every day | ORAL | 2 refills | Status: DC
Start: 2024-07-08 — End: 2024-08-10

## 2024-07-08 NOTE — Telephone Encounter (Signed)
 Rx has been sent, pt is aware. Advised pt to reach back out after vacation to schedule an OV for follow up on BP.   Copied from CRM #[id number]. Topic: Clinical - Prescription Issue >> Jul 08, 2024 11:37 AM Alfonso ORN wrote: Reason for CRM: pt called for update on rx refill request as pharmacy never received it. Contacted CAL who stated that >> Jul 08, 2024 11:57 AM Alfonso ORN wrote: CAL unsure of why rx was routed to [facility] as [facility] has been pcp as of this year. Please contact pt with update as he needs rx before travel tomorrow.

## 2024-07-15 ENCOUNTER — Ambulatory Visit: Payer: Medicare Other | Admitting: Internal Medicine

## 2024-08-02 ENCOUNTER — Encounter: Payer: Self-pay | Admitting: Radiology

## 2024-08-10 ENCOUNTER — Other Ambulatory Visit: Payer: Self-pay

## 2024-08-10 ENCOUNTER — Encounter: Payer: Self-pay | Admitting: Family

## 2024-08-10 DIAGNOSIS — E782 Mixed hyperlipidemia: Secondary | ICD-10-CM

## 2024-08-10 MED ORDER — ROSUVASTATIN CALCIUM 5 MG PO TABS: 5.0000 mg | ORAL_TABLET | Freq: Every day | ORAL | 0 refills | Status: AC

## 2024-09-07 ENCOUNTER — Other Ambulatory Visit: Payer: Self-pay | Admitting: Family

## 2024-09-07 NOTE — Telephone Encounter (Signed)
 Last OV: 01/06/2024  Next OV: 01/06/2025  Last Refill: 02/25/2024  Dispense: 180

## 2024-09-08 NOTE — Telephone Encounter (Signed)
ok to refill, thx

## 2024-09-09 DIAGNOSIS — H5213 Myopia, bilateral: Secondary | ICD-10-CM | POA: Diagnosis not present

## 2024-09-16 DIAGNOSIS — K080 Exfoliation of teeth due to systemic causes: Secondary | ICD-10-CM | POA: Diagnosis not present

## 2024-09-20 ENCOUNTER — Ambulatory Visit: Payer: Medicare Other | Admitting: Nurse Practitioner

## 2024-10-05 ENCOUNTER — Encounter: Payer: Self-pay | Admitting: Family

## 2024-11-03 ENCOUNTER — Other Ambulatory Visit: Payer: Self-pay | Admitting: Family

## 2024-11-04 ENCOUNTER — Encounter: Payer: Self-pay | Admitting: Family

## 2024-11-04 DIAGNOSIS — G4700 Insomnia, unspecified: Secondary | ICD-10-CM

## 2024-11-04 MED ORDER — ESZOPICLONE 3 MG PO TABS: ORAL_TABLET | ORAL | 0 refills | Status: AC

## 2024-11-04 NOTE — Telephone Encounter (Signed)
RX sent, thx

## 2025-01-06 ENCOUNTER — Ambulatory Visit

## 2025-01-10 ENCOUNTER — Encounter: Admitting: Family
